# Patient Record
Sex: Male | Born: 1950
Health system: Southern US, Community
[De-identification: ages and names within clinical notes are randomized; demographics above are authoritative.]

## PROBLEM LIST (undated history)

## (undated) DIAGNOSIS — J189 Pneumonia, unspecified organism: Secondary | ICD-10-CM

## (undated) DIAGNOSIS — I509 Heart failure, unspecified: Secondary | ICD-10-CM

## (undated) DIAGNOSIS — J9611 Chronic respiratory failure with hypoxia: Secondary | ICD-10-CM

## (undated) DIAGNOSIS — W5911XA Bitten by nonvenomous snake, initial encounter: Secondary | ICD-10-CM

---
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
## 1978-04-15 DIAGNOSIS — W5911XA Bitten by nonvenomous snake, initial encounter: Secondary | ICD-10-CM

## 1978-04-15 HISTORY — DX: Bitten by nonvenomous snake, initial encounter: W59.11XA

## 2016-01-16 DIAGNOSIS — I509 Heart failure, unspecified: Secondary | ICD-10-CM

## 2016-01-16 DIAGNOSIS — J9600 Acute respiratory failure, unspecified whether with hypoxia or hypercapnia: Secondary | ICD-10-CM

## 2016-01-16 DIAGNOSIS — E661 Drug-induced obesity: Secondary | ICD-10-CM

## 2016-01-16 DIAGNOSIS — I501 Left ventricular failure: Secondary | ICD-10-CM

## 2016-01-17 DIAGNOSIS — I509 Heart failure, unspecified: Secondary | ICD-10-CM | POA: Diagnosis not present

## 2016-01-17 DIAGNOSIS — E661 Drug-induced obesity: Secondary | ICD-10-CM | POA: Diagnosis not present

## 2016-01-17 DIAGNOSIS — J9600 Acute respiratory failure, unspecified whether with hypoxia or hypercapnia: Secondary | ICD-10-CM | POA: Diagnosis not present

## 2016-01-17 DIAGNOSIS — I501 Left ventricular failure: Secondary | ICD-10-CM | POA: Diagnosis not present

## 2016-01-18 DIAGNOSIS — I501 Left ventricular failure: Secondary | ICD-10-CM | POA: Diagnosis not present

## 2016-01-18 DIAGNOSIS — J9600 Acute respiratory failure, unspecified whether with hypoxia or hypercapnia: Secondary | ICD-10-CM | POA: Diagnosis not present

## 2016-01-18 DIAGNOSIS — I509 Heart failure, unspecified: Secondary | ICD-10-CM | POA: Diagnosis not present

## 2016-01-18 DIAGNOSIS — E661 Drug-induced obesity: Secondary | ICD-10-CM | POA: Diagnosis not present

## 2016-01-19 DIAGNOSIS — J9600 Acute respiratory failure, unspecified whether with hypoxia or hypercapnia: Secondary | ICD-10-CM | POA: Diagnosis not present

## 2016-01-19 DIAGNOSIS — I509 Heart failure, unspecified: Secondary | ICD-10-CM | POA: Diagnosis not present

## 2016-01-19 DIAGNOSIS — E661 Drug-induced obesity: Secondary | ICD-10-CM | POA: Diagnosis not present

## 2016-01-19 DIAGNOSIS — I501 Left ventricular failure: Secondary | ICD-10-CM | POA: Diagnosis not present

## 2016-01-20 DIAGNOSIS — I509 Heart failure, unspecified: Secondary | ICD-10-CM | POA: Diagnosis not present

## 2016-01-20 DIAGNOSIS — I501 Left ventricular failure: Secondary | ICD-10-CM | POA: Diagnosis not present

## 2016-01-20 DIAGNOSIS — J9600 Acute respiratory failure, unspecified whether with hypoxia or hypercapnia: Secondary | ICD-10-CM | POA: Diagnosis not present

## 2016-01-20 DIAGNOSIS — E661 Drug-induced obesity: Secondary | ICD-10-CM | POA: Diagnosis not present

## 2016-01-21 ENCOUNTER — Inpatient Hospital Stay (HOSPITAL_COMMUNITY)
Admission: AD | Admit: 2016-01-21 | Discharge: 2016-01-30 | DRG: 189 | Disposition: A | Discharge status: Home / Self Care | Payer: Medicaid Other | Source: Other Acute Inpatient Hospital | Attending: Internal Medicine | Admitting: Internal Medicine

## 2016-01-21 ENCOUNTER — Inpatient Hospital Stay (HOSPITAL_COMMUNITY): Payer: Medicaid Other

## 2016-01-21 DIAGNOSIS — J9601 Acute respiratory failure with hypoxia: Secondary | ICD-10-CM | POA: Diagnosis present

## 2016-01-21 DIAGNOSIS — J9621 Acute and chronic respiratory failure with hypoxia: Principal | ICD-10-CM

## 2016-01-21 DIAGNOSIS — R0902 Hypoxemia: Secondary | ICD-10-CM

## 2016-01-21 DIAGNOSIS — Z87891 Personal history of nicotine dependence: Secondary | ICD-10-CM | POA: Diagnosis not present

## 2016-01-21 DIAGNOSIS — I509 Heart failure, unspecified: Secondary | ICD-10-CM | POA: Diagnosis not present

## 2016-01-21 DIAGNOSIS — E119 Type 2 diabetes mellitus without complications: Secondary | ICD-10-CM

## 2016-01-21 DIAGNOSIS — J810 Acute pulmonary edema: Secondary | ICD-10-CM | POA: Diagnosis present

## 2016-01-21 DIAGNOSIS — J9602 Acute respiratory failure with hypercapnia: Secondary | ICD-10-CM | POA: Diagnosis present

## 2016-01-21 DIAGNOSIS — E873 Alkalosis: Secondary | ICD-10-CM | POA: Diagnosis not present

## 2016-01-21 DIAGNOSIS — I517 Cardiomegaly: Secondary | ICD-10-CM | POA: Diagnosis not present

## 2016-01-21 DIAGNOSIS — E1165 Type 2 diabetes mellitus with hyperglycemia: Secondary | ICD-10-CM | POA: Diagnosis present

## 2016-01-21 DIAGNOSIS — Z6826 Body mass index (BMI) 26.0-26.9, adult: Secondary | ICD-10-CM | POA: Diagnosis not present

## 2016-01-21 DIAGNOSIS — D751 Secondary polycythemia: Secondary | ICD-10-CM | POA: Diagnosis present

## 2016-01-21 DIAGNOSIS — I272 Pulmonary hypertension, unspecified: Secondary | ICD-10-CM | POA: Diagnosis present

## 2016-01-21 DIAGNOSIS — J8410 Pulmonary fibrosis, unspecified: Secondary | ICD-10-CM | POA: Diagnosis present

## 2016-01-21 DIAGNOSIS — I501 Left ventricular failure: Secondary | ICD-10-CM | POA: Diagnosis not present

## 2016-01-21 DIAGNOSIS — R0602 Shortness of breath: Secondary | ICD-10-CM | POA: Diagnosis present

## 2016-01-21 DIAGNOSIS — J9600 Acute respiratory failure, unspecified whether with hypoxia or hypercapnia: Secondary | ICD-10-CM | POA: Diagnosis not present

## 2016-01-21 DIAGNOSIS — E661 Drug-induced obesity: Secondary | ICD-10-CM | POA: Diagnosis not present

## 2016-01-21 DIAGNOSIS — J9690 Respiratory failure, unspecified, unspecified whether with hypoxia or hypercapnia: Secondary | ICD-10-CM

## 2016-01-21 DIAGNOSIS — J849 Interstitial pulmonary disease, unspecified: Secondary | ICD-10-CM | POA: Diagnosis not present

## 2016-01-21 DIAGNOSIS — J439 Emphysema, unspecified: Secondary | ICD-10-CM | POA: Diagnosis not present

## 2016-01-21 HISTORY — DX: Pneumonia, unspecified organism: J18.9

## 2016-01-21 HISTORY — DX: Bitten by nonvenomous snake, initial encounter: W59.11XA

## 2016-01-21 LAB — COMPREHENSIVE METABOLIC PANEL
ALBUMIN: 2.6 g/dL — AB (ref 3.5–5.0)
ALK PHOS: 48 U/L (ref 38–126)
ALT: 21 U/L (ref 17–63)
ANION GAP: 8 (ref 5–15)
AST: 25 U/L (ref 15–41)
BUN: 15 mg/dL (ref 6–20)
CHLORIDE: 79 mmol/L — AB (ref 101–111)
CO2: 49 mmol/L — AB (ref 22–32)
Calcium: 8 mg/dL — ABNORMAL LOW (ref 8.9–10.3)
Creatinine, Ser: 0.81 mg/dL (ref 0.61–1.24)
GFR calc non Af Amer: >60 mL/min (ref >60)
GLUCOSE: 162 mg/dL — AB (ref 65–99)
POTASSIUM: 3.6 mmol/L (ref 3.5–5.1)
SODIUM: 136 mmol/L (ref 135–145)
Total Bilirubin: 1.2 mg/dL (ref 0.3–1.2)
Total Protein: 6.5 g/dL (ref 6.5–8.1)

## 2016-01-21 LAB — CBC WITH DIFFERENTIAL/PLATELET
BASOS PCT: 0 %
Basophils Absolute: 0 10*3/uL (ref 0.0–0.1)
EOS ABS: 0 10*3/uL (ref 0.0–0.7)
EOS PCT: 0 %
HCT: 52.3 % — ABNORMAL HIGH (ref 39.0–52.0)
HEMOGLOBIN: 14.9 g/dL (ref 13.0–17.0)
LYMPHS ABS: 0.6 10*3/uL — AB (ref 0.7–4.0)
Lymphocytes Relative: 9 %
MCH: 24.5 pg — AB (ref 26.0–34.0)
MCHC: 28.5 g/dL — AB (ref 30.0–36.0)
MCV: 85.9 fL (ref 78.0–100.0)
MONOS PCT: 5 %
Monocytes Absolute: 0.4 10*3/uL (ref 0.1–1.0)
NEUTROS PCT: 86 %
Neutro Abs: 5.9 10*3/uL (ref 1.7–7.7)
PLATELETS: 94 10*3/uL — AB (ref 150–400)
RBC: 6.09 MIL/uL — ABNORMAL HIGH (ref 4.22–5.81)
RDW: 17.8 % — ABNORMAL HIGH (ref 11.5–15.5)
WBC: 6.8 10*3/uL (ref 4.0–10.5)

## 2016-01-21 LAB — BLOOD GAS, ARTERIAL
ACID-BASE EXCESS: 28.6 mmol/L — AB (ref 0.0–2.0)
Bicarbonate: 55.6 mmol/L — ABNORMAL HIGH (ref 20.0–28.0)
DRAWN BY: 42624
Delivery systems: POSITIVE
Expiratory PAP: 6
FIO2: 0.35
INSPIRATORY PAP: 12
LHR: 12 {breaths}/min
O2 SAT: 88 %
PATIENT TEMPERATURE: 98.6
PCO2 ART: 90.4 mmHg — AB (ref 32.0–48.0)
PH ART: 7.406 (ref 7.350–7.450)
PO2 ART: 55.9 mmHg — AB (ref 83.0–108.0)

## 2016-01-21 LAB — MAGNESIUM: Magnesium: 1.8 mg/dL (ref 1.7–2.4)

## 2016-01-21 MED ORDER — SODIUM CHLORIDE 0.9 % IV SOLN: 250.0000 mL | INTRAVENOUS | Status: DC | PRN

## 2016-01-21 MED ORDER — FUROSEMIDE 10 MG/ML IJ SOLN
40.0000 mg | Freq: Once | INTRAMUSCULAR | Status: AC
  Administered 2016-01-21: 40 mg via INTRAVENOUS
  Filled 2016-01-21: qty 4

## 2016-01-21 NOTE — Progress Notes (Signed)
eLink Physician-Brief Progress Note Patient Name: Zachary Brown DOB: 04/13/1951 MRN: [id number]   Date of Service  01/21/2016  HPI/Events of Note  ABG on 35%/EPAP 12/IPAP 6 = 7.40/90.455.9/55.6. Discussed with Dr. Tyson Alias >> put on high flow O2.    eICU Interventions  Will order: 1. D/C BiPAP. 2. High Flow O2. Keep sat 88% to 90%.     Intervention Category Major Interventions: Respiratory failure - evaluation and management;Acid-Base disturbance - evaluation and management  Sommer,Steven Eugene 01/21/2016, 10:13 PM

## 2016-01-21 NOTE — Progress Notes (Signed)
eLink Physician-Brief Progress Note Patient Name: Zachary Brown DOB: 20-Jan-1951 MRN: [id number]   Date of Service  01/21/2016  HPI/Events of Note  CXR looks like CHF to my read. Creatinine = 0.9. BP = 134/83.  eICU Interventions  Will order: 1. Lasix 40 mg IV now. 2. CBC now. 3. CMP now. 4. Mg++ level now.      Intervention Category Intermediate Interventions: Diagnostic test evaluation  Sommer,Steven Eugene 01/21/2016, 9:45 PM

## 2016-01-22 ENCOUNTER — Encounter (HOSPITAL_COMMUNITY): Payer: Self-pay | Admitting: *Deleted

## 2016-01-22 DIAGNOSIS — J9601 Acute respiratory failure with hypoxia: Secondary | ICD-10-CM

## 2016-01-22 DIAGNOSIS — J849 Interstitial pulmonary disease, unspecified: Secondary | ICD-10-CM

## 2016-01-22 DIAGNOSIS — R0902 Hypoxemia: Secondary | ICD-10-CM

## 2016-01-22 LAB — GLUCOSE, CAPILLARY
GLUCOSE-CAPILLARY: 258 mg/dL — AB (ref 65–99)
GLUCOSE-CAPILLARY: 199 mg/dL — AB (ref 65–99)
Glucose-Capillary: 277 mg/dL — ABNORMAL HIGH (ref 65–99)

## 2016-01-22 LAB — CBC WITH DIFFERENTIAL/PLATELET
Basophils Absolute: 0 10*3/uL (ref 0.0–0.1)
Basophils Relative: 0 %
EOS PCT: 0 %
Eosinophils Absolute: 0 10*3/uL (ref 0.0–0.7)
HCT: 52.7 % — ABNORMAL HIGH (ref 39.0–52.0)
Hemoglobin: 14.8 g/dL (ref 13.0–17.0)
LYMPHS ABS: 0.5 10*3/uL — AB (ref 0.7–4.0)
LYMPHS PCT: 6 %
MCH: 24.4 pg — AB (ref 26.0–34.0)
MCHC: 28.1 g/dL — AB (ref 30.0–36.0)
MCV: 87 fL (ref 78.0–100.0)
MONO ABS: 0.2 10*3/uL (ref 0.1–1.0)
MONOS PCT: 3 %
Neutro Abs: 6.3 10*3/uL (ref 1.7–7.7)
Neutrophils Relative %: 90 %
PLATELETS: 100 10*3/uL — AB (ref 150–400)
RBC: 6.06 MIL/uL — ABNORMAL HIGH (ref 4.22–5.81)
RDW: 17.5 % — AB (ref 11.5–15.5)
WBC: 7 10*3/uL (ref 4.0–10.5)

## 2016-01-22 LAB — MAGNESIUM: Magnesium: 1.8 mg/dL (ref 1.7–2.4)

## 2016-01-22 LAB — RESPIRATORY PANEL BY PCR
Adenovirus: NOT DETECTED
Bordetella pertussis: NOT DETECTED
CHLAMYDOPHILA PNEUMONIAE-RVPPCR: NOT DETECTED
CORONAVIRUS OC43-RVPPCR: NOT DETECTED
Coronavirus 229E: NOT DETECTED
Coronavirus HKU1: NOT DETECTED
Coronavirus NL63: NOT DETECTED
INFLUENZA A-RVPPCR: NOT DETECTED
INFLUENZA B-RVPPCR: NOT DETECTED
MYCOPLASMA PNEUMONIAE-RVPPCR: NOT DETECTED
Metapneumovirus: NOT DETECTED
PARAINFLUENZA VIRUS 1-RVPPCR: NOT DETECTED
PARAINFLUENZA VIRUS 4-RVPPCR: NOT DETECTED
Parainfluenza Virus 2: NOT DETECTED
Parainfluenza Virus 3: NOT DETECTED
RESPIRATORY SYNCYTIAL VIRUS-RVPPCR: NOT DETECTED
Rhinovirus / Enterovirus: NOT DETECTED

## 2016-01-22 LAB — BASIC METABOLIC PANEL
ANION GAP: 7 (ref 5–15)
BUN: 17 mg/dL (ref 6–20)
CALCIUM: 8.5 mg/dL — AB (ref 8.9–10.3)
CO2: 50 mmol/L — ABNORMAL HIGH (ref 22–32)
CREATININE: 0.78 mg/dL (ref 0.61–1.24)
Chloride: 80 mmol/L — ABNORMAL LOW (ref 101–111)
GFR calc non Af Amer: >60 mL/min (ref >60)
Glucose, Bld: 223 mg/dL — ABNORMAL HIGH (ref 65–99)
Potassium: 4 mmol/L (ref 3.5–5.1)
SODIUM: 137 mmol/L (ref 135–145)

## 2016-01-22 LAB — SEDIMENTATION RATE: Sed Rate: 2 mm/hr (ref 0–16)

## 2016-01-22 LAB — PROTIME-INR
INR: 1.16
PROTHROMBIN TIME: 14.8 s (ref 11.4–15.2)

## 2016-01-22 LAB — PHOSPHORUS: Phosphorus: 1.4 mg/dL — ABNORMAL LOW (ref 2.5–4.6)

## 2016-01-22 LAB — APTT: APTT: 32 s (ref 24–36)

## 2016-01-22 LAB — MRSA PCR SCREENING: MRSA by PCR: NEGATIVE

## 2016-01-22 LAB — C-REACTIVE PROTEIN: CRP: 1 mg/dL — ABNORMAL HIGH (ref <1.0)

## 2016-01-22 MED ORDER — INFLUENZA VAC SPLIT QUAD 0.5 ML IM SUSY
0.5000 mL | PREFILLED_SYRINGE | INTRAMUSCULAR | Status: DC
  Filled 2016-01-22: qty 0.5

## 2016-01-22 MED ORDER — POTASSIUM CHLORIDE CRYS ER 20 MEQ PO TBCR
40.0000 meq | EXTENDED_RELEASE_TABLET | Freq: Once | ORAL | Status: AC
  Administered 2016-01-22: 40 meq via ORAL
  Filled 2016-01-22: qty 2

## 2016-01-22 MED ORDER — METHYLPREDNISOLONE SODIUM SUCC 125 MG IJ SOLR
60.0000 mg | Freq: Three times a day (TID) | INTRAMUSCULAR | Status: DC
  Administered 2016-01-22 – 2016-01-27 (×16): 60 mg via INTRAVENOUS
  Filled 2016-01-22 (×18): qty 2

## 2016-01-22 MED ORDER — ENOXAPARIN SODIUM 40 MG/0.4ML ~~LOC~~ SOLN
40.0000 mg | SUBCUTANEOUS | Status: DC
  Administered 2016-01-22 – 2016-01-30 (×9): 40 mg via SUBCUTANEOUS
  Filled 2016-01-22 (×9): qty 0.4

## 2016-01-22 MED ORDER — PNEUMOCOCCAL VAC POLYVALENT 25 MCG/0.5ML IJ INJ
0.5000 mL | INJECTION | INTRAMUSCULAR | Status: DC
  Filled 2016-01-22: qty 0.5

## 2016-01-22 MED ORDER — FUROSEMIDE 10 MG/ML IJ SOLN
40.0000 mg | Freq: Three times a day (TID) | INTRAMUSCULAR | Status: DC
  Administered 2016-01-22 – 2016-01-23 (×4): 40 mg via INTRAVENOUS
  Filled 2016-01-22 (×4): qty 4

## 2016-01-22 MED ORDER — METHYLPREDNISOLONE SODIUM SUCC 125 MG IJ SOLR: 60.0000 mg | Freq: Three times a day (TID) | INTRAMUSCULAR | Status: DC

## 2016-01-22 MED ORDER — INSULIN ASPART 100 UNIT/ML ~~LOC~~ SOLN
0.0000 [IU] | Freq: Three times a day (TID) | SUBCUTANEOUS | Status: DC
  Administered 2016-01-22: 5 [IU] via SUBCUTANEOUS
  Administered 2016-01-22: 2 [IU] via SUBCUTANEOUS
  Administered 2016-01-22: 5 [IU] via SUBCUTANEOUS
  Administered 2016-01-23: 3 [IU] via SUBCUTANEOUS
  Administered 2016-01-23: 5 [IU] via SUBCUTANEOUS
  Administered 2016-01-23: 2 [IU] via SUBCUTANEOUS
  Administered 2016-01-24 (×2): 5 [IU] via SUBCUTANEOUS
  Administered 2016-01-24: 9 [IU] via SUBCUTANEOUS
  Administered 2016-01-25: 2 [IU] via SUBCUTANEOUS
  Administered 2016-01-25: 3 [IU] via SUBCUTANEOUS
  Administered 2016-01-25: 7 [IU] via SUBCUTANEOUS
  Administered 2016-01-26: 3 [IU] via SUBCUTANEOUS
  Administered 2016-01-26: 7 [IU] via SUBCUTANEOUS
  Administered 2016-01-26: 2 [IU] via SUBCUTANEOUS

## 2016-01-22 NOTE — H&P (Signed)
Name: Zachary Brown MRN: [id number] DOB: [birth year]    ADMISSION DATE:  01/21/2016 CONSULTATION DATE: 01/22/16   REFERRING MD :  Karyl Kinnieraneville, MD  CHIEF COMPLAINT:  Bilateral infiltrates  BRIEF PATIENT DESCRIPTION: 65 yr old bilateral infiltrates, 5 days [facility] no improvement   STudies 10/2 CT chest>>>bilateral infiltrates, no PE 10/5 VQ>>>int prob  10/7 CT chest>>>improving 10/8 [facility]   HISTORY OF PRESENT ILLNESS:  65 smoker, some farming inhlation foam?, presented progressive SOb and leg swelling to [facility] [place].  Did receive outpt ABX without improvement. Had leg swelling also. [facility] with treatment lasix and steroids wit slow progress. Had VQ int prob and Ctr chest neg PE but showed bilateral mosaic infiltrates. No fevers , no CP. Received [employer] [place]. Had repeat CT with improved ILD. Remained hypoxic. [facility] but showed up without outr knowledge on 10/8 in SDU. Was on BIPAP, but had NO increase WOB. Bipap dc'ed, O2 45% required. We were asked to intubate him to bronch th ept, which of course was not agreed upon. Remained cujlture neg. Echo showed normal EF%.  Showed mod Pa htn and dilated IVC all c/w rV chronic overload vs acute on chronic.  PAST MEDICAL HISTORY :   has no past medical history on file. 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 Prior to Admission medications   Not on File   Allergies not on file  FAMILY HISTORY:  family history is not on file. 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  HENT: Negative for hearing loss, ear pain, nosebleeds, congestion, sore throat, neck pain, tinnitus and ear discharge.   Eyes: Negative for blurred vision, double vision, photophobia, pain, discharge and  redness.  Respiratory: post nasal drip clearing of throat chronic  Cardiovascular: Negative for chest pain, palpitations, POS orthopnea, claudication, leg swelling and PND.  Gastrointestinal: Negative for heartburn, nausea, vomiting, abdominal pain, diarrhea, constipation, blood in stool and melena.  Genitourinary: Negative for dysuria, urgency, frequency, hematuria and flank pain.  Musculoskeletal: Negative for myalgias, back pain, joint pain and falls.  Skin: Negative for itching and rash.  Neurological: Negative for dizziness, tingling, tremors, sensory change, speech change, focal weakness, seizures, loss of consciousness, weakness and headaches.  Endo/Heme/Allergies: Negative for environmental allergies and polydipsia. Does not bruise/bleed easily.  SUBJECTIVE:   VITAL SIGNS: Temp:  [97.4 F (36.3 C)-97.7 F (36.5 C)] 97.4 F (36.3 C) (10/08 2318) Pulse Rate:  [88-90] 90 (10/08 2318) Resp:  [22-23] 23 (10/08 2318) BP: (133-134)/(83-88) 133/88 (10/08 2318) SpO2:  [92 %-94 %] 94 % (10/08 2318) FiO2 (%):  [35 %-45 %] 45 % (10/08 2318) Weight:  [88.1 kg (194 lb 3.6 oz)] 88.1 kg (194 lb 3.6 oz) (10/08 2100)  PHYSICAL EXAMINATION: General:  Awake, no distress Neuro:  Nonfocal, perr HEENT:  jvd present Cardiovascular:  s1 s12 RRR no r/g Lungs:  Coarse and reduced Abdomen:  Soft, BS wnl, no r Musculoskeletal:  Edema lowers  3 Skin:  No rash   Recent Labs Lab 01/21/16 2206  NA 136  K 3.6  CL 79*  CO2 49*  BUN 15  CREATININE 0.81  GLUCOSE 162*    Recent Labs Lab 01/21/16 2206  HGB 14.9  HCT 52.3*  WBC  6.8  PLT 94*   Dg Chest Port 1 View  Result Date: 01/21/2016 CLINICAL DATA:  Acute onset of hypoxia.  Initial encounter. EXAM: PORTABLE CHEST 1 VIEW COMPARISON:  Chest radiograph and CT of the chest performed 01/20/2016 FINDINGS: The lungs are well-aerated. Vascular congestion is noted. Increased interstitial markings raise concern for pulmonary edema or pneumonia.  There is no evidence of pleural effusion or pneumothorax. The cardiomediastinal silhouette is borderline normal in size. No acute osseous abnormalities are seen. IMPRESSION: Vascular congestion noted. Increased interstitial markings raise concern for pulmonary edema or pneumonia. Electronically Signed   By: Roanna Raider M.D.   On: 01/21/2016 22:01    ASSESSMENT / PLAN:  Hypoxic resp failure, chronic hypercapnic compensated resp failure ILD - likely Noninfectious, fibrosis PULM HTN R/o pulm edema component on top of chronic lung dz Unlikely infectious bacterial PNA R/o viral  Secondary polycythemia (hypoxia)  His last CT has shown improvement, consider steroids to continue With NO increase WOB, dc bipap ABG Lasix to continued to neg balance pcxr in am, overall this has improved Viral panel if not done at [facility] Dc lasix drip, use int lasix Chem  scd lovenox , crt daily Would NOT bronch him, he would need intubation and MAY not liberate well off Consider auti immune panel  Mcarthur Rossetti. Tyson Alias, MD, FACP Pgr: [phone] [facility]  Pulmonary and Critical Care Medicine [facility] Pager: [phone]  01/22/2016, 1:03 AM

## 2016-01-23 ENCOUNTER — Inpatient Hospital Stay (HOSPITAL_COMMUNITY): Payer: Medicaid Other

## 2016-01-23 DIAGNOSIS — E873 Alkalosis: Secondary | ICD-10-CM

## 2016-01-23 LAB — GLUCOSE, CAPILLARY
GLUCOSE-CAPILLARY: 234 mg/dL — AB (ref 65–99)
Glucose-Capillary: 204 mg/dL — ABNORMAL HIGH (ref 65–99)
Glucose-Capillary: 281 mg/dL — ABNORMAL HIGH (ref 65–99)
Glucose-Capillary: 190 mg/dL — ABNORMAL HIGH (ref 65–99)

## 2016-01-23 LAB — C4 COMPLEMENT: Complement C4, Body Fluid: 18 mg/dL (ref 14–44)

## 2016-01-23 LAB — MPO/PR-3 (ANCA) ANTIBODIES

## 2016-01-23 LAB — ANCA TITERS
C-ANCA: <1:20 {titer}
P-ANCA: <1:20 {titer}

## 2016-01-23 LAB — COMPLEMENT, TOTAL: Compl, Total (CH50): >60 U/mL — ABNORMAL HIGH (ref 42–60)

## 2016-01-23 LAB — BRAIN NATRIURETIC PEPTIDE: B Natriuretic Peptide: 255 pg/mL — ABNORMAL HIGH (ref 0.0–100.0)

## 2016-01-23 LAB — SEDIMENTATION RATE: Sed Rate: 0 mm/hr (ref 0–16)

## 2016-01-23 LAB — ANTINUCLEAR ANTIBODIES, IFA: ANTINUCLEAR ANTIBODIES, IFA: NEGATIVE

## 2016-01-23 LAB — C3 COMPLEMENT: C3 Complement: 117 mg/dL (ref 82–167)

## 2016-01-23 LAB — C-REACTIVE PROTEIN: CRP: <0.8 mg/dL (ref <1.0)

## 2016-01-23 LAB — RHEUMATOID FACTOR: Rhuematoid fact SerPl-aCnc: 10 IU/mL (ref 0.0–13.9)

## 2016-01-23 MED ORDER — INFLUENZA VAC SPLIT QUAD 0.5 ML IM SUSY: 0.5000 mL | PREFILLED_SYRINGE | INTRAMUSCULAR | Status: DC | PRN

## 2016-01-23 MED ORDER — PNEUMOCOCCAL VAC POLYVALENT 25 MCG/0.5ML IJ INJ: 0.5000 mL | INJECTION | INTRAMUSCULAR | Status: DC | PRN

## 2016-01-23 MED ORDER — POTASSIUM PHOSPHATES 15 MMOLE/5ML IV SOLN
30.0000 mmol | Freq: Once | INTRAVENOUS | Status: AC
  Administered 2016-01-23: 30 mmol via INTRAVENOUS
  Filled 2016-01-23: qty 10

## 2016-01-23 MED ORDER — LEVOFLOXACIN 750 MG PO TABS
750.0000 mg | ORAL_TABLET | Freq: Every day | ORAL | Status: AC
  Administered 2016-01-23 – 2016-01-29 (×7): 750 mg via ORAL
  Filled 2016-01-23 (×7): qty 1

## 2016-01-23 MED ORDER — ZOLPIDEM TARTRATE 5 MG PO TABS
5.0000 mg | ORAL_TABLET | Freq: Once | ORAL | Status: AC
  Administered 2016-01-23: 5 mg via ORAL
  Filled 2016-01-23: qty 1

## 2016-01-23 MED ORDER — ACETAZOLAMIDE SODIUM 500 MG IJ SOLR
500.0000 mg | Freq: Four times a day (QID) | INTRAMUSCULAR | Status: AC
  Administered 2016-01-23 – 2016-01-24 (×3): 500 mg via INTRAVENOUS
  Filled 2016-01-23 (×3): qty 500

## 2016-01-23 NOTE — Care Management Note (Addendum)
Case Management Note  Patient Details  Name: Zachary Brown MRN: [id number] Date of Birth: Aug 26, 1950  Subjective/Objective:  Patient lives with wife, pta indep, lives in [place], he went to urgent care before coming to hospital, he hs no PCP, would like to go to our [facility] clinic. He has apt scheduled for 10/16 at 2 :30.  He has transportation at discharge.  He is not on home oxygen.  NCM will cont to follow for dc needs.     01/24/16 Letha Cape RN, NCM- patient most likely will need home oxygen, patient states he would like to work with [facility], made referral to [facility] with [facility]  For charity for home oxygen, patient has no insurance.  He has chronic hypercapnic compensated resp failure.             Action/Plan:   Expected Discharge Date:                  Expected Discharge Plan:  Home/Self Care  In-House Referral:     Discharge planning Services  CM Consult, Medication Assistance, Indigent Health Clinic  Post Acute Care Choice:    Choice offered to:     DME Arranged:    DME Agency:     HH Arranged:    HH Agency:     Status of Service:  In process, will continue to follow  If discussed at Long Length of Stay Meetings, dates discussed:    Additional Comments:  Leone Haven, RN 01/23/2016, 4:54 PM

## 2016-01-23 NOTE — Progress Notes (Addendum)
Name: Zachary Brown MRN: [id number] DOB: Dec 23, 1950    ADMISSION DATE:  01/21/2016 CONSULTATION DATE: 01/22/16   REFERRING MD :  Karyl Kinnier, MD  CHIEF COMPLAINT:  Bilateral infiltrates  BRIEF PATIENT DESCRIPTION: 65 yr old former smoker ( Quit 34 years ago) with  bilateral infiltrates. He received  5 days lasix and steroids at [facility] with  no improvement. Transferred to [facility] for further treatment/ work up 10/8   Studies 10/2 CT chest>>>bilateral infiltrates, no PE 10/5 VQ>>>int prob  10/7 CT chest>>>improving 10/8 [facility]  Cultures: 01/22/2016>> Viral Panel Negative 01/22/2016>> MRSA Negative   HISTORY OF PRESENT ILLNESS:  27 smoker with history of inhaling  some farming  foam?, presented with progressive SOB and leg swelling to [place] 10./3.  Did receive outpt ABX without improvement. At [place] he with treatment lasix and steroids with slow progress. Had VQ int prob and Ctr chest neg PE but showed bilateral mosaic infiltrates. No fevers , no CP. Received [employer]. Had repeat CT with improved ILD. Remained hypoxic. [facility] but showed up without our knowledge on 10/8 in SDU. Was on BIPAP, but had NO increase WOB. Bipap dc'ed, O2 45% required. We were asked to intubate him to bronch th ept, which of course was not agreed upon. Remained culture neg. Echo showed normal EF%.  Showed mod PA htn and dilated IVC all c/w RV chronic overload vs acute on chronic.  SUBJECTIVE:  Pt. States his breathing is much improved. Feels better.  VITAL SIGNS: Temp:  [97.3 F (36.3 C)-98.6 F (37 C)] 97.7 F (36.5 C) (10/10 0751) Pulse Rate:  [87-107] 98 (10/10 0309) Resp:  [13-33] 30 (10/10 0309) BP: (131-147)/(63-94) 147/82 (10/10 0309) SpO2:  [88 %-94 %] 94 % (10/10 0309) FiO2 (%):  [50 %-60 %] 60 % (10/10 0114) Weight:  [180 lb 12.4 oz (82 kg)] 180 lb 12.4 oz (82 kg) (10/10 0500)  PHYSICAL EXAMINATION: General:  Awake, no distress, on high  flow oxygen Neuro:  Nonfocal, perr HEENT:  jvd present Cardiovascular:  s1 s12 RRR no r/g Lungs:  Coarse and reduced, crackles L>R Abdomen:  Soft, BS wnl, no r Musculoskeletal:  Edema lowers 2+ Skin:  No rash, tenting   Recent Labs Lab 01/21/16 2206 01/22/16 0547  NA 136 137  K 3.6 4.0  CL 79* 80*  CO2 49* 50*  BUN 15 17  CREATININE 0.81 0.78  GLUCOSE 162* 223*    Recent Labs Lab 01/21/16 2206 01/22/16 0547  HGB 14.9 14.8  HCT 52.3* 52.7*  WBC 6.8 7.0  PLT 94* 100*   Dg Chest Port 1 View  Result Date: 01/21/2016 CLINICAL DATA:  Acute onset of hypoxia.  Initial encounter. EXAM: PORTABLE CHEST 1 VIEW COMPARISON:  Chest radiograph and CT of the chest performed 01/20/2016 FINDINGS: The lungs are well-aerated. Vascular congestion is noted. Increased interstitial markings raise concern for pulmonary edema or pneumonia. There is no evidence of pleural effusion or pneumothorax. The cardiomediastinal silhouette is borderline normal in size. No acute osseous abnormalities are seen. IMPRESSION: Vascular congestion noted. Increased interstitial markings raise concern for pulmonary edema or pneumonia. Electronically Signed   By: Roanna Raider M.D.   On: 01/21/2016 22:01    ASSESSMENT / PLAN:  Pulmonary:  A:  Hypoxic resp failure, chronic hypercapnic compensated resp failure ILD - likely Noninfectious, fibrosis PULM HTN R/o pulm edema component on top of chronic lung dz Unlikely infectious bacterial PNA Secondary polycythemia (hypoxia) P:  Continue Steroids Continue High Flow Oxygen Titrate for SaO2>> 90% Lasix to continued to neg balance>> lower extremity edema improving Trend Daily CXR Viral panel negative Continue Lasix 40 IV q 8 Trend BMET Daily Replete electrolytes as needed SCD's lovenox , crt daily NO bronch for now, he would need intubation and MAY not liberate well off Consider auto immune panel Consider Cards consult  Will need outpatient follow up for ILD,  PAH after DC  Pt. Updated on his progress and plan of care at bedside. No family present  Bevelyn NgoSarah F. Groce, AGACNP-BC [place] [facility] Pager: [phone]  01/23/2016, 8:43 AM  Attending Note:  65 year old male who had no respiratory complaints 2 wks ago presenting now with acute hypoxemic respiratory failure with concern for bronchiolitis vs atypical infection.  Failing to improve in amount of O2 demand.  On exam, diffuse crackles in all lung fields.  I reviewed CXR myself, improved aeration and CT with acute inflammation noted.  Discussed with PCCM-NP.  Patient wishes to be full code for now but no prolonged respiratory support.  Acute hypoxemic respiratory failure: etiology unknown.  - Titrate O2 for sat of 88-92%.  - Continue steroids  ?ILD:  - Steroids.  - Auto-immune panel  ?atypical infection  - Levaquin  - F/U on culture  Acute pulmonary edema  - 2D echo  - BNP  Contraction alkalosis  - D/C lasix  - Add diamox  - BMET in AM  Patient seen and examined, agree with above note.  I dictated the care and orders written for this patient under my direction.  Alyson ReedyWesam G Xzavior Reinig, MD [phone]

## 2016-01-23 NOTE — Progress Notes (Signed)
eLink Physician-Brief Progress Note Patient Name: Zachary Brown DOB: [birth year] MRN: [id number]   Date of Service  01/23/2016  HPI/Events of Note  Pt asking for a "sleeping pill"  eICU Interventions  ambien 5mg  ordered     Intervention Category Minor Interventions: Routine modifications to care plan (e.g. PRN medications for pain, fever)  Ainslee Sou S. 01/23/2016, 8:47 PM

## 2016-01-23 NOTE — Progress Notes (Signed)
Inpatient Diabetes Program Recommendations  AACE/ADA: New Consensus Statement on Inpatient Glycemic Control (2015)  Target Ranges:  Prepandial:   less than 140 mg/dL      Peak postprandial:   less than 180 mg/dL (1-2 hours)      Critically ill patients:  140 - 180 mg/dL   Lab Results  Component Value Date   GLUCAP 234 (H) 01/23/2016    Review of Glycemic Control  Diabetes history: No prior hx Current orders for Inpatient glycemic control: Novolog correction 0-9 units tid  Inpatient Diabetes Program Recommendations:    Please consider A1c and add Novolog correction 0-5 units hs.  Thank you, Billy Fischer. Denney Shein, RN, MSN, CDE Inpatient Glycemic Control Team Team Pager [phone] (8am-5pm) 01/23/2016 10:26 AM

## 2016-01-24 ENCOUNTER — Inpatient Hospital Stay (HOSPITAL_COMMUNITY): Payer: Medicaid Other

## 2016-01-24 LAB — GLUCOSE, CAPILLARY
GLUCOSE-CAPILLARY: 352 mg/dL — AB (ref 65–99)
GLUCOSE-CAPILLARY: 290 mg/dL — AB (ref 65–99)
GLUCOSE-CAPILLARY: 280 mg/dL — AB (ref 65–99)
Glucose-Capillary: 283 mg/dL — ABNORMAL HIGH (ref 65–99)

## 2016-01-24 LAB — CBC
HEMATOCRIT: 52 % (ref 39.0–52.0)
HEMOGLOBIN: 15.1 g/dL (ref 13.0–17.0)
MCH: 24 pg — ABNORMAL LOW (ref 26.0–34.0)
MCHC: 29 g/dL — AB (ref 30.0–36.0)
MCV: 82.8 fL (ref 78.0–100.0)
PLATELETS: 107 10*3/uL — AB (ref 150–400)
RBC: 6.28 MIL/uL — ABNORMAL HIGH (ref 4.22–5.81)
RDW: 17.5 % — AB (ref 11.5–15.5)
WBC: 7.4 10*3/uL (ref 4.0–10.5)

## 2016-01-24 LAB — ANGIOTENSIN CONVERTING ENZYME: Angiotensin-Converting Enzyme: 50 U/L (ref 14–82)

## 2016-01-24 LAB — BASIC METABOLIC PANEL
ANION GAP: 8 (ref 5–15)
BUN: 22 mg/dL — ABNORMAL HIGH (ref 6–20)
CALCIUM: 8.7 mg/dL — AB (ref 8.9–10.3)
CHLORIDE: 93 mmol/L — AB (ref 101–111)
CO2: 35 mmol/L — AB (ref 22–32)
Creatinine, Ser: 0.96 mg/dL (ref 0.61–1.24)
GFR calc Af Amer: >60 mL/min (ref >60)
GFR calc non Af Amer: >60 mL/min (ref >60)
GLUCOSE: 348 mg/dL — AB (ref 65–99)
Potassium: 3.7 mmol/L (ref 3.5–5.1)
Sodium: 136 mmol/L (ref 135–145)

## 2016-01-24 LAB — PHOSPHORUS: PHOSPHORUS: 3 mg/dL (ref 2.5–4.6)

## 2016-01-24 LAB — MAGNESIUM: Magnesium: 2 mg/dL (ref 1.7–2.4)

## 2016-01-24 MED ORDER — ACETAZOLAMIDE SODIUM 500 MG IJ SOLR
250.0000 mg | Freq: Four times a day (QID) | INTRAMUSCULAR | Status: AC
  Administered 2016-01-24 – 2016-01-25 (×3): 250 mg via INTRAVENOUS
  Filled 2016-01-24 (×4): qty 250

## 2016-01-24 MED ORDER — FUROSEMIDE 10 MG/ML IJ SOLN
40.0000 mg | Freq: Three times a day (TID) | INTRAMUSCULAR | Status: AC
  Administered 2016-01-24 (×2): 40 mg via INTRAVENOUS
  Filled 2016-01-24 (×2): qty 4

## 2016-01-24 NOTE — Progress Notes (Signed)
RT titrated FIO2 to 30% and flow to 20L. Pt remains comfortable, sat 91%. MD sat goal 88 - 92%.  RN notified.

## 2016-01-24 NOTE — Progress Notes (Signed)
Inpatient Diabetes Program Recommendations  AACE/ADA: New Consensus Statement on Inpatient Glycemic Control (2015)  Target Ranges:  Prepandial:   less than 140 mg/dL      Peak postprandial:   less than 180 mg/dL (1-2 hours)      Critically ill patients:  140 - 180 mg/dL   Lab Results  Component Value Date   GLUCAP 290 (H) 01/24/2016    Review of Glycemic Control Results for [name], [name] (MRN [id number]) as of 01/24/2016 11:45  Ref. Range 01/23/2016 07:52 01/23/2016 11:36 01/23/2016 16:16 01/23/2016 21:46 01/24/2016 08:22 01/24/2016 11:40  Glucose-Capillary Latest Ref Range: 65 - 99 mg/dL 956 (H) 213 (H) 086 (H) 190 (H) 352 (H) 290 (H)   Diabetes history: No prior hx Current orders for Inpatient glycemic control: Novolog correction 0-9 units tid  Inpatient Diabetes Program Recommendations:    Noted CBGs 190-352.  -Increase Novolog correction to moderate 0-15 units tid+0-5 units hs -A1c   Thank you, Zachary Brown. Zachary Lashley, RN, MSN, CDE Inpatient Glycemic Control Team Team Pager [phone] (8am-5pm) 01/24/2016 11:48 AM

## 2016-01-24 NOTE — Progress Notes (Signed)
Name: Zachary Brown MRN: [id number] DOB: [birth year]    ADMISSION DATE:  01/21/2016 CONSULTATION DATE: 01/22/16   REFERRING MD :  Karyl Kinnier, MD  CHIEF COMPLAINT:  Bilateral infiltrates  BRIEF PATIENT DESCRIPTION: 65 yr old former smoker ( Quit 34 years ago) with  bilateral infiltrates. He received  5 days lasix and steroids at [facility] with  no improvement. Transferred to [facility] for further treatment/ work up 10/8   Studies 10/2 CT chest>>>bilateral infiltrates, no PE 10/5 VQ>>>int prob  10/7 CT chest>>>improving 10/8 [facility]  Cultures: 01/22/2016>> Viral Panel Negative 01/22/2016>> MRSA Negative   HISTORY OF PRESENT ILLNESS:  24 smoker with history of inhaling  some farming  foam?, presented with progressive SOB and leg swelling to [place] 10./3.  Did receive outpt ABX without improvement. At [place] he with treatment lasix and steroids with slow progress. Had VQ int prob and Ctr chest neg PE but showed bilateral mosaic infiltrates. No fevers , no CP. Received [employer]. Had repeat CT with improved ILD. Remained hypoxic. [facility] but showed up without our knowledge on 10/8 in SDU. Was on BIPAP, but had NO increase WOB. Bipap dc'ed, O2 45% required. We were asked to intubate him to bronch th ept, which of course was not agreed upon. Remained culture neg. Echo showed normal EF%.  Showed mod PA htn and dilated IVC all c/w RV chronic overload vs acute on chronic.  SUBJECTIVE:  Pt. States his breathing is much improved. Feels better.  VITAL SIGNS: Temp:  [97.2 F (36.2 C)-98.1 F (36.7 C)] 97.2 F (36.2 C) (10/11 0720) Pulse Rate:  [87-109] 94 (10/11 0912) Resp:  [16-24] 24 (10/11 0912) BP: (127-148)/(67-90) 127/81 (10/11 0720) SpO2:  [92 %-96 %] 92 % (10/11 0912) FiO2 (%):  [50 %] 50 % (10/11 0912) Weight:  [80.9 kg (178 lb 6.4 oz)] 80.9 kg (178 lb 6.4 oz) (10/11 0347)  PHYSICAL EXAMINATION: General:  Awake, no distress, on high  flow oxygen Neuro:  Nonfocal, perr HEENT:  jvd present Cardiovascular:  s1 s12 RRR no r/g Lungs:  Coarse and reduced, crackles L>R Abdomen:  Soft, BS wnl, no r Musculoskeletal:  Edema lowers 2+ Skin:  No rash, tenting   Recent Labs Lab 01/21/16 2206 01/22/16 0547 01/24/16 0355  NA 136 137 136  K 3.6 4.0 3.7  CL 79* 80* 93*  CO2 49* 50* 35*  BUN 15 17 22*  CREATININE 0.81 0.78 0.96  GLUCOSE 162* 223* 348*    Recent Labs Lab 01/21/16 2206 01/22/16 0547 01/24/16 0355  HGB 14.9 14.8 15.1  HCT 52.3* 52.7* 52.0  WBC 6.8 7.0 7.4  PLT 94* 100* 107*   Dg Chest Port 1 View  Result Date: 01/24/2016 CLINICAL DATA:  Acute on chronic respiratory failure with hypoxemia EXAM: PORTABLE CHEST 1 VIEW COMPARISON:  01/23/2016 FINDINGS: Mild improvement in bilateral airspace disease. Improvement is greater on the left than the right. Heart size upper normal. Negative for pleural effusion. IMPRESSION: Interval improvement in bilateral airspace disease. Differential includes pulmonary edema and infection. Electronically Signed   By: Marlan Palau M.D.   On: 01/24/2016 09:21   Dg Chest Port 1 View  Result Date: 01/23/2016 CLINICAL DATA:  Interstitial lung disease.  Shortness of breath. EXAM: PORTABLE CHEST 1 VIEW COMPARISON:  01/21/2016 FINDINGS: There continues to be patchy interstitial and airspace densities throughout both lungs. Slightly improved aeration in the right lung. No significant change in the left lung. Heart  and mediastinum are within normal limits. The trachea is midline. Small foci of sclerosis in the proximal left humerus could be related to an enchondroma. IMPRESSION: Minimal change in the bilateral interstitial and airspace disease. Electronically Signed   By: Richarda OverlieAdam  Henn M.D.   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  Failing to improve in amount of O2 demand.  On exam, diffuse crackles in all lung fields.  I reviewed CXR myself, improved aeration and CT with acute inflammation noted.  Discussed with PCCM-NP.  Patient wishes to be full code for now but no prolonged respiratory support.  Acute hypoxemic respiratory failure: etiology unknown.  - Titrate O2 for sat of 88-92%.  - Continue steroids  ?ILD:  - Continue steroids.  - Would consider pirfenidone as outpatient but not right now.  - Auto-immune panel ordered and pending  ?atypical infection  - Levaquin as ordered  - F/U on culture  Acute pulmonary edema  - 2D echo pending  - BNP 225  Contraction alkalosis  - Two additional doses of lasix  - Diamox for 3 doses.  - BMET in AM.  - Replace electrolytes as indicated.  Spoke with patient at length regarding code status, he decided he would not want to be intubated.  Will make DNI.   Patient seen and examined, agree with above note.  I dictated the care and orders written for this patient under my direction.  Alyson ReedyWesam G Kiowa Hollar, MD [phone]

## 2016-01-25 ENCOUNTER — Inpatient Hospital Stay (HOSPITAL_COMMUNITY): Payer: Medicaid Other

## 2016-01-25 DIAGNOSIS — I517 Cardiomegaly: Secondary | ICD-10-CM

## 2016-01-25 DIAGNOSIS — J810 Acute pulmonary edema: Secondary | ICD-10-CM

## 2016-01-25 LAB — GLUCOSE, CAPILLARY
GLUCOSE-CAPILLARY: 175 mg/dL — AB (ref 65–99)
Glucose-Capillary: 312 mg/dL — ABNORMAL HIGH (ref 65–99)
Glucose-Capillary: 204 mg/dL — ABNORMAL HIGH (ref 65–99)
Glucose-Capillary: 377 mg/dL — ABNORMAL HIGH (ref 65–99)

## 2016-01-25 LAB — CBC
HEMATOCRIT: 51.4 % (ref 39.0–52.0)
Hemoglobin: 15.4 g/dL (ref 13.0–17.0)
MCH: 24.5 pg — AB (ref 26.0–34.0)
MCHC: 30 g/dL (ref 30.0–36.0)
MCV: 81.7 fL (ref 78.0–100.0)
Platelets: 114 10*3/uL — ABNORMAL LOW (ref 150–400)
RBC: 6.29 MIL/uL — ABNORMAL HIGH (ref 4.22–5.81)
RDW: 17.3 % — AB (ref 11.5–15.5)
WBC: 8.7 10*3/uL (ref 4.0–10.5)

## 2016-01-25 LAB — ECHOCARDIOGRAM COMPLETE
CHL CUP MV DEC (S): 225
EERAT: 8.92
EWDT: 225 ms
FS: 30 % (ref 28–44)
HEIGHTINCHES: 69 in
IVS/LV PW RATIO, ED: 1.04
LA ID, A-P, ES: 28 mm
LA diam end sys: 28 mm
LA vol A4C: 24.7 ml
LA vol index: 15.9 mL/m2
LADIAMINDEX: 1.41 cm/m2
LAVOL: 31.6 mL
LV E/e' medial: 8.92
LV e' LATERAL: 9.9 cm/s
LVEEAVG: 8.92
LVOT VTI: 20.6 cm
LVOT area: 2.27 cm2
LVOT diameter: 17 mm
LVOT peak grad rest: 6 mmHg
LVOT peak vel: 121 cm/s
LVOTSV: 47 mL
MV Peak grad: 3 mmHg
MV pk A vel: 102 m/s
MVPKEVEL: 88.3 m/s
PW: 12.7 mm — AB (ref 0.6–1.1)
RV TAPSE: 20.2 mm
TDI e' lateral: 9.9
TDI e' medial: 8.49
WEIGHTICAEL: 2836 [oz_av]

## 2016-01-25 LAB — BASIC METABOLIC PANEL
Anion gap: 7 (ref 5–15)
BUN: 23 mg/dL — AB (ref 6–20)
CALCIUM: 8.7 mg/dL — AB (ref 8.9–10.3)
CHLORIDE: 97 mmol/L — AB (ref 101–111)
CO2: 30 mmol/L (ref 22–32)
CREATININE: 0.89 mg/dL (ref 0.61–1.24)
GFR calc non Af Amer: >60 mL/min (ref >60)
Glucose, Bld: 368 mg/dL — ABNORMAL HIGH (ref 65–99)
Potassium: 3.5 mmol/L (ref 3.5–5.1)
Sodium: 134 mmol/L — ABNORMAL LOW (ref 135–145)

## 2016-01-25 LAB — FANA STAINING PATTERNS

## 2016-01-25 LAB — MAGNESIUM: Magnesium: 1.8 mg/dL (ref 1.7–2.4)

## 2016-01-25 LAB — PHOSPHORUS: PHOSPHORUS: 2.7 mg/dL (ref 2.5–4.6)

## 2016-01-25 LAB — ANTINUCLEAR ANTIBODIES, IFA: ANA Ab, IFA: POSITIVE — AB

## 2016-01-25 NOTE — Progress Notes (Signed)
  Echocardiogram 2D Echocardiogram has been performed.  Zachary Brown 01/25/2016, 2:44 PM

## 2016-01-25 NOTE — Progress Notes (Signed)
Inpatient Diabetes Program Recommendations  AACE/ADA: New Consensus Statement on Inpatient Glycemic Control (2015)  Target Ranges:  Prepandial:   less than 140 mg/dL      Peak postprandial:   less than 180 mg/dL (1-2 hours)      Critically ill patients:  140 - 180 mg/dL     Review of Glycemic Control  Results for Zachary Brown, Zachary Brown (MRN [id number]) as of 01/25/2016 13:22  Ref. Range 01/24/2016 08:22 01/24/2016 11:40 01/24/2016 17:09 01/24/2016 21:59 01/25/2016 07:50 01/25/2016 11:41  Glucose-Capillary Latest Ref Range: 65 - 99 mg/dL 244 (H) 628 (H) 638 (H) 280 (H) 312 (H) 175 (H)    Inpatient Diabetes Program Recommendations:    Diabetes history:No prior hx Current orders for Inpatient glycemic control: Novolog correction 0-9 units tid  Inpatient Diabetes Program Recommendations:    Noted CBGs 175-312.  -Increase Novolog correction to moderate 0-15 units tid+0-5 units hs -A1c   Thank you, Billy Fischer. Fradel Baldonado, RN, MSN, CDE Inpatient Glycemic Control Team Team Pager [phone] (8am-5pm) 01/25/2016 1:25 PM

## 2016-01-25 NOTE — Progress Notes (Signed)
Per Dr Molli Knock, wean pt from HFNC. Pt placed on [place] high flow at 10L, sat 98%. Pt tolerating well.

## 2016-01-25 NOTE — Progress Notes (Signed)
Name: Almond Lintony Ray Snead MRN: [id number] DOB: [birth year]    ADMISSION DATE:  01/21/2016 CONSULTATION DATE: 01/22/16   REFERRING MD :  Karyl Kinnieraneville, MD  CHIEF COMPLAINT:  Bilateral infiltrates  BRIEF PATIENT DESCRIPTION: 65 yr old former smoker ( Quit 34 years ago) with  bilateral infiltrates. He received  5 days lasix and steroids [facility] with  no improvement. [facility]/ work up 10/8   Studies 10/2 CT chest>>>bilateral infiltrates, no PE 10/5 VQ>>>int prob  10/7 CT chest>>>improving 10/8 [facility]  Cultures: 01/22/2016>> Viral Panel Negative 01/22/2016>> MRSA Negative   HISTORY OF PRESENT ILLNESS:  [age] smoker with history of inhaling  some farming  foam?, presented with progressive SOB and leg swelling [place].  Did receive outpt ABX without improvement. [place] [place] with [place] las[place] and steroids with slow progress. Had VQ int prob and Ctr chest neg PE but showed bilateral mosaic infiltrates. No fevers , no CP. Received [employer] [place]. Had repeat CT with improved ILD. Remained hypoxic. [facility] but showed up without our knowledge on 10/8 in SDU. Was on BIPAP, but had NO increase WOB. Bipap dc'ed, O2 45% required. We were asked to intubate him to bronch th ept, which of course was not agreed upon. Remained culture neg. Echo showed normal EF%.  Showed mod PA htn and dilated IVC all c/w RV chronic overload vs acute on chronic.  SUBJECTIVE:  Patient states feeling better.  VITAL SIGNS: Temp:  [97.5 F (36.4 C)-98.1 F (36.7 C)] 97.8 F (36.6 C) (10/12 1201) Pulse Rate:  [89-108] 94 (10/12 1201) Resp:  [22-26] 23 (10/12 1201) BP: (133-145)/(80-90) 133/87 (10/12 1201) SpO2:  [88 %-95 %] 95 % (10/12 1201) FiO2 (%):  [30 %-35 %] 35 % (10/12 1346) Weight:  [80.4 kg (177 lb 4 oz)] 80.4 kg (177 lb 4 oz) (10/12 0500)  PHYSICAL EXAMINATION: General:  Awake, no distress, on high flow oxygen Neuro:   Nonfocal, perr HEENT:  jvd present Cardiovascular:  s1 s12 RRR no r/g Lungs:  Coarse and reduced, crackles L>R Abdomen:  Soft, BS wnl, no r Musculoskeletal:  Edema lowers 2+ Skin:  No rash, tenting   Recent Labs Lab 01/22/16 0547 01/24/16 0355 01/25/16 0452  NA 137 136 134*  K 4.0 3.7 3.5  CL 80* 93* 97*  CO2 50* 35* 30  BUN 17 22* 23*  CREATININE 0.78 0.96 0.89  GLUCOSE 223* 348* 368*    Recent Labs Lab 01/22/16 0547 01/24/16 0355 01/25/16 0452  HGB 14.8 15.1 15.4  HCT 52.7* 52.0 51.4  WBC 7.0 7.4 8.7  PLT 100* 107* 114*   Dg Chest Port 1 View  Result Date: 01/24/2016 CLINICAL DATA:  Acute on chronic respiratory failure with hypoxemia EXAM: PORTABLE CHEST 1 VIEW COMPARISON:  01/23/2016 FINDINGS: Mild improvement in bilateral airspace disease. Improvement is greater on the left than the right. Heart size upper normal. Negative for pleural effusion. IMPRESSION: Interval improvement in bilateral airspace disease. Differential includes pulmonary edema and infection. Electronically Signed   By: Marlan Palauharles  Clark M.D.   On: 01/24/2016 09:21    ASSESSMENT / PLAN:  Pulmonary:  A:  Hypoxic resp failure, chronic hypercapnic compensated resp failure ILD - likely Noninfectious, fibrosis PULM HTN R/o pulm edema component on top of chronic lung dz Unlikely infectious bacterial PNA Secondary polycythemia (hypoxia) P:  65 year old male who had no respiratory complaints 2 wks ago presenting now with acute  hypoxemic respiratory failure with concern for bronchiolitis vs atypical infection.  Failing to improve in amount of O2 demand.  On exam, diffuse crackles in all lung fields.  I reviewed CXR myself, improved aeration and CT with acute inflammation noted.  Discussed with PCCM-NP.  Patient wishes to be full code for now but no prolonged respiratory support.  Acute hypoxemic respiratory failure: etiology unknown.  - Titrate O2 for sat of 88-92%.  - Continue steroids  - Spoke with  RT will attempt to get to standard nasal canula today and evaluate O2 need.  ?ILD:  - Continue steroids.  - Would consider pirfenidone as outpatient but not right now.  - Auto-immune panel ordered and pending  ?atypical infection  - Levaquin as ordered  - F/U on culture  Acute pulmonary edema  - 2D echo pending  - BNP 225  Contraction alkalosis  - Two additional doses of lasix  - Diamox for 3 doses.  - BMET in AM.  - Replace electrolytes as indicated.  DNI status confirmed.  Patient seen and examined, agree with above note.  I dictated the care and orders written for this patient under my direction.  Alyson Reedy, MD [phone]

## 2016-01-26 LAB — BASIC METABOLIC PANEL
Anion gap: 7 (ref 5–15)
BUN: 21 mg/dL — ABNORMAL HIGH (ref 6–20)
CALCIUM: 8.5 mg/dL — AB (ref 8.9–10.3)
CO2: 27 mmol/L (ref 22–32)
CREATININE: 0.85 mg/dL (ref 0.61–1.24)
Chloride: 102 mmol/L (ref 101–111)
GFR calc Af Amer: >60 mL/min (ref >60)
GFR calc non Af Amer: >60 mL/min (ref >60)
GLUCOSE: 385 mg/dL — AB (ref 65–99)
Potassium: 4.3 mmol/L (ref 3.5–5.1)
Sodium: 136 mmol/L (ref 135–145)

## 2016-01-26 LAB — GLUCOSE, CAPILLARY
GLUCOSE-CAPILLARY: 189 mg/dL — AB (ref 65–99)
Glucose-Capillary: 211 mg/dL — ABNORMAL HIGH (ref 65–99)
Glucose-Capillary: 304 mg/dL — ABNORMAL HIGH (ref 65–99)
Glucose-Capillary: 435 mg/dL — ABNORMAL HIGH (ref 65–99)

## 2016-01-26 MED ORDER — INSULIN GLARGINE 100 UNIT/ML ~~LOC~~ SOLN
12.0000 [IU] | Freq: Every day | SUBCUTANEOUS | Status: DC
  Administered 2016-01-26 – 2016-01-28 (×3): 12 [IU] via SUBCUTANEOUS
  Filled 2016-01-26 (×4): qty 0.12

## 2016-01-26 MED ORDER — FUROSEMIDE 10 MG/ML IJ SOLN
40.0000 mg | Freq: Two times a day (BID) | INTRAMUSCULAR | Status: DC
  Administered 2016-01-26: 40 mg via INTRAVENOUS
  Filled 2016-01-26: qty 4

## 2016-01-26 MED ORDER — ASPIRIN EC 81 MG PO TBEC
81.0000 mg | DELAYED_RELEASE_TABLET | ORAL | Status: DC
  Administered 2016-01-27 – 2016-01-30 (×3): 81 mg via ORAL
  Filled 2016-01-26 (×3): qty 1

## 2016-01-26 MED ORDER — ACETAZOLAMIDE SODIUM 500 MG IJ SOLR
250.0000 mg | Freq: Four times a day (QID) | INTRAMUSCULAR | Status: AC
  Administered 2016-01-26 – 2016-01-27 (×3): 250 mg via INTRAVENOUS
  Filled 2016-01-26 (×3): qty 250

## 2016-01-26 MED ORDER — FUROSEMIDE 10 MG/ML IJ SOLN
40.0000 mg | Freq: Three times a day (TID) | INTRAMUSCULAR | Status: AC
  Administered 2016-01-26: 40 mg via INTRAVENOUS
  Filled 2016-01-26: qty 4

## 2016-01-26 MED ORDER — POTASSIUM CHLORIDE CRYS ER 20 MEQ PO TBCR
40.0000 meq | EXTENDED_RELEASE_TABLET | Freq: Three times a day (TID) | ORAL | Status: AC
  Administered 2016-01-26 (×2): 40 meq via ORAL
  Filled 2016-01-26 (×2): qty 2

## 2016-01-26 MED ORDER — INSULIN ASPART 100 UNIT/ML ~~LOC~~ SOLN
0.0000 [IU] | SUBCUTANEOUS | Status: DC
  Administered 2016-01-26: 20 [IU] via SUBCUTANEOUS
  Administered 2016-01-27: 7 [IU] via SUBCUTANEOUS
  Administered 2016-01-27 (×3): 4 [IU] via SUBCUTANEOUS
  Administered 2016-01-27: 11 [IU] via SUBCUTANEOUS
  Administered 2016-01-27 – 2016-01-28 (×2): 3 [IU] via SUBCUTANEOUS
  Administered 2016-01-29: 4 [IU] via SUBCUTANEOUS

## 2016-01-26 MED ORDER — ALBUTEROL SULFATE (2.5 MG/3ML) 0.083% IN NEBU: 3.0000 mL | INHALATION_SOLUTION | Freq: Four times a day (QID) | RESPIRATORY_TRACT | Status: DC | PRN

## 2016-01-26 NOTE — Progress Notes (Signed)
Patient ambulated 300 feet on 6L of oxygen with sats 88-90%. Patient had no complaints of shortness of breath and tolerated the walk well. His HR was 116-120 sinus tach during the walk. Patient returned to room and was placed on 5L o2 with sats 100% and HR 100. Will continue to monitor.

## 2016-01-26 NOTE — Progress Notes (Signed)
Inpatient Diabetes Program Recommendations  AACE/ADA: New Consensus Statement on Inpatient Glycemic Control (2015)  Target Ranges:  Prepandial:   less than 140 mg/dL      Peak postprandial:   less than 180 mg/dL (1-2 hours)      Critically ill patients:  140 - 180 mg/dL   Lab Results  Component Value Date   GLUCAP 211 (H) 01/26/2016    Review of Glycemic Control  Results for Zachary Brown, Zachary Brown (MRN [id number]) as of 01/26/2016 14:04  Ref. Range 01/25/2016 11:41 01/25/2016 17:46 01/25/2016 20:18 01/26/2016 07:53 01/26/2016 13:30  Glucose-Capillary Latest Ref Range: 65 - 99 mg/dL 174 (H) 944 (H) 967 (H) 189 (H) 211 (H)      Diabetes history:No prior hx  Current orders for Inpatient glycemic control: Novolog correction 0-9 units tid  *Solumedrol 60mg  q8h  Inpatient Diabetes Program Recommendations: Please consider increasing Novolog correction to moderate scale 0-15 units tid and adding Novolog 0-5 units qhs  Consider adding low dose basal insulin (fasting blood sugar 189mg /dl) 12 units qhs  Consider ordering an A1C   Consider changing diet to carb modified/heart healthy diet.   Susette Racer, RN, BA, MHA, CDE Diabetes Coordinator Inpatient Diabetes Program  [phone] (Team Pager) [phone] [facility] Office) 01/26/2016 2:17 PM

## 2016-01-26 NOTE — Progress Notes (Signed)
Report called to Misty Stanley, receiving RN on  2 west. VSS. Transferred to 2W04 via wheelchair with personal belongings.Patients family at bedside.  [facility]

## 2016-01-26 NOTE — Progress Notes (Deleted)
Name: Zachary Brown States MRN: [id number] DOB: [birth year]    ADMISSION DATE:  01/21/2016 CONSULTATION DATE: 01/22/16   REFERRING MD :  Karyl Kinnieraneville, MD  CHIEF COMPLAINT:  Bilateral infiltrates  BRIEF PATIENT DESCRIPTION: 65 yr old former smoker ( Quit 34 years ago) with  bilateral infiltrates. He received  5 days lasix and [facility] with  no improvement. [facility]/ work up 10/8   Studies 10/2 CT chest>>>bilateral infiltrates, no PE 10/5 VQ>>>int prob  10/7 CT chest>>>improving 10/8 [facility] 10[facility]12 Echo>> Normal LV systolic function; grade 1 diastolic dysfunction; mild   LVH; mild RAE and RVE; moderately reduced RV function. EF: 55-60%  Cultures: 01/22/2016>> Viral Panel Negative 01/22/2016>> MRSA Negative   HISTORY OF PRESENT ILLNESS:  [age] smoker with history of inhaling  some farming  foam?, presented with progressive SOB and leg swelling [place].  Did receive outpt ABX without improvement. [place] he with treatment lasix and steroids with slow progress. Had VQ int prob and Ctr chest neg PE but showed bilateral mosaic infiltrates. No fevers , no CP. Received [employer]  [place]. Had repeat CT with improved ILD. Remained hypoxic. [facility] but showed up without our knowledge on 10/8 in SDU. Was on BIPAP, but had NO increase WOB. Bipap dc'ed, O2 45% required. We were asked to intubate him to bronch th ept, which of course was not agreed upon. Remained culture neg. Echo showed normal EF%.  Showed mod PA htn and dilated IVC all c/w RV chronic overload vs acute on chronic.  SUBJECTIVE:  Continues to feel better, less short of breath. Walked in his room with assist last night.  VITAL SIGNS: Temp:  [97.5 F (36.4 C)-98.7 F (37.1 C)] 98.4 F (36.9 C) (10/13 0700) Pulse Rate:  [90-113] 96 (10/13 0700) Resp:  [16-33] 22 (10/13 0700) BP: (131-138)/(77-92) 131/85 (10/13 0700) SpO2:  [93 %-98 %] 98 % (10/13  0700) FiO2 (%):  [35 %] 35 % (10/12 1346) Weight:  [175 lb 8 oz (79.6 kg)] 175 lb 8 oz (79.6 kg) (10/13 11910621)  PHYSICAL EXAMINATION: General:  Awake, no distress,weaned to  5L [place] Neuro:  Nonfocal, perr HEENT:  jvd present Cardiovascular:  s1 s12 RRR no r/g Lungs:  Coarse and reduced, fewer crackles this am Abdomen:  Soft, BS wnl, no r Musculoskeletal:  Edema lowers 2+ Skin:  No rash, tenting, some areas of skin breakdown per lower extremities.   Recent Labs Lab 01/24/16 0355 01/25/16 0452 01/26/16 0347  NA 136 134* 136  K 3.7 3.5 4.3  CL 93* 97* 102  CO2 35* 30 27  BUN 22* 23* 21*  CREATININE 0.96 0.89 0.85  GLUCOSE 348* 368* 385*    Recent Labs Lab 01/22/16 0547 01/24/16 0355 01/25/16 0452  HGB 14.8 15.1 15.4  HCT 52.7* 52.0 51.4  WBC 7.0 7.4 8.7  PLT 100* 107* 114*   Immune Panel   Ref. Range 01/22/2016 05:47  ANA Ab, IFA Unknown Negative  ANCA Proteinase 3 Latest Ref Range: 0.0 - 3.5 U/mL <3.5  Myeloperoxidase Abs Latest Ref Range: 0.0 - 9.0 U/mL <9.0  RA Latex Turbid. Latest Ref Range: 0.0 - 13.9 IU/mL 10.0  Cytoplasmic (C-ANCA) Latest Ref Range: Neg:<1:20 titer <1:20  P-ANCA Latest Ref Range: Neg:<1:20 titer <1:20  Atypical P-ANCA titer Latest Ref Range: Neg:<1:20 titer <1:20  C3 Complement Latest Ref Range: 82 - 167 mg/dL 478117  Compl, Total (GN56CH50) Latest Ref Range:  42 - 60 U/mL >60 (H)  Complement C4, Body Fluid Latest Ref Range: 14 - 44 mg/dL 18    Ref. Range 01/23/2016 10:59  Homogeneous Pattern Unknown 1:320 (H)   No results found.  ASSESSMENT / PLAN:  Pulmonary:  A:  Hypoxic resp failure, chronic hypercapnic compensated resp failure ILD - likely Noninfectious, fibrosis PULM HTN R/o pulm edema component on top of chronic lung dz Unlikely infectious bacterial PNA Secondary polycythemia (hypoxia) P:  65 year old male who had no respiratory complaints 2 wks ago presenting now with acute hypoxemic respiratory failure with concern for  bronchiolitis vs atypical infection.  Failing to improve in amount of O2 demand.  On exam, diffuse crackles slightly improved in all lung fields.  CXR and CT with acute inflammation . Pt. Currently wishes DNI status.  Acute hypoxemic respiratory failure: etiology unknown.  - Titrate O2 for sat of 88-92%.  - Continue steroids  - Pt. Has been weaned to 5L [place] overnight ?ILD:  - Continue steroids.  - Would consider pirfenidone as outpatient as out patient.  - Auto-immune panel results as above  ? atypical infection  - Continue Levaquin  - F/U on culture  Acute pulmonary edema  - 2D echo : EF: 55-60%:Grade 1 diastolic dysfunction ( See above)  - BNP 225  Contraction alkalosis    - Lasix x 2 additional doses today  - BMET in am  - Replace electrolytes as indicated.  DNI status confirmed. Continues to improve clinically, requiring  less oxygen, ambulating with assist Transfer to Med - Surg Tele bed Ambulatory Desaturation Study Pulmonary and Cardiology Follow Up on D/C   Bevelyn Ngo, AGACNP-BC [facility] Pulmonary/Critical Care Medicine Pager # [phone] .01/26/2016   Alyson Reedy, M.D. [facility] Pulmonary/Critical Care Medicine. Pager: [phone]. After hours pager: [phone].

## 2016-01-26 NOTE — Progress Notes (Signed)
CRITICAL VALUE ALERT  Critical value received:  CBG 435  Date of notification:  01/26/16  Time of notification: 2030  Critical value read back:Yes.  by Nurse tech  Nurse who received alert:  Laury Axon  MD notified (1st page):  Paged Critical care team  Time of first page:  2045  Responding MD:  Cyril Mourning   Orders to give highest sliding scale  (20) insulin, q4h CBG checks and add Lantus 12 units

## 2016-01-26 NOTE — Progress Notes (Addendum)
Name: Zachary Brown MRN: [id number] DOB: Dec 28, 1950    ADMISSION DATE:  01/21/2016 CONSULTATION DATE: 01/22/16   REFERRING MD :  Zachary Kinnier, MD  CHIEF COMPLAINT:  Bilateral infiltrates  BRIEF PATIENT DESCRIPTION: 65 yr old former smoker ( Quit 34 years ago) with  bilateral infiltrates. He received  5 days lasix and steroids at [facility] with  no improvement. Transferred to [facility] for further treatment/ work up 10/8   Studies 10/2 CT chest>>>bilateral infiltrates, no PE 10/5 VQ>>>int prob  10/7 CT chest>>>improving 10/8 [facility]  Cultures: 01/22/2016>> Viral Panel Negative 01/22/2016>> MRSA Negative   HISTORY OF PRESENT ILLNESS:  22 smoker with history of inhaling  some farming  foam?, presented with progressive SOB and leg swelling to [place] 10./3.  Did receive outpt ABX without improvement. At [place] he with treatment lasix and steroids with slow progress. Had VQ int prob and Ctr chest neg PE but showed bilateral mosaic infiltrates. No fevers , no CP. Received [employer]. Had repeat CT with improved ILD. Remained hypoxic. [facility] but showed up without our knowledge on 10/8 in SDU. Was on BIPAP, but had NO increase WOB. Bipap dc'ed, O2 45% required. We were asked to intubate him to bronch th ept, which of course was not agreed upon. Remained culture neg. Echo showed normal EF%.  Showed mod PA htn and dilated IVC all c/w RV chronic overload vs acute on chronic.  SUBJECTIVE:  Patient states feeling better again this AM, eating and off HFNC, on 5L [place]  VITAL SIGNS: Temp:  [97.5 F (36.4 C)-98.7 F (37.1 C)] 98.4 F (36.9 C) (10/13 0700) Pulse Rate:  [90-113] 94 (10/13 0813) Resp:  [16-33] 25 (10/13 0813) BP: (131-138)/(77-96) 134/96 (10/13 0813) SpO2:  [93 %-100 %] 100 % (10/13 0813) FiO2 (%):  [35 %] 35 % (10/12 1346) Weight:  [79.6 kg (175 lb 8 oz)] 79.6 kg (175 lb 8 oz) (10/13 7829)  PHYSICAL EXAMINATION: General:  Awake, no  distress, on 5L [place] Neuro:  Awake and interactive, moving all ext to command HEENT:  [place]/AT, PERRL, EOM-I and MMM Cardiovascular:  RRR, Nl S1/S2, -M/R/G. Lungs:  Bibasilar crackles. Abdomen:  Soft, BS wnl, no r Musculoskeletal:  -edema now Skin:  No rash, tenting   Recent Labs Lab 01/24/16 0355 01/25/16 0452 01/26/16 0347  NA 136 134* 136  K 3.7 3.5 4.3  CL 93* 97* 102  CO2 35* 30 27  BUN 22* 23* 21*  CREATININE 0.96 0.89 0.85  GLUCOSE 348* 368* 385*    Recent Labs Lab 01/22/16 0547 01/24/16 0355 01/25/16 0452  HGB 14.8 15.1 15.4  HCT 52.7* 52.0 51.4  WBC 7.0 7.4 8.7  PLT 100* 107* 114*   No results found.  I reviewed CXR myself, improvement of infiltrate with diurses.  ASSESSMENT / PLAN:  Pulmonary:  A:  Hypoxic resp failure, chronic hypercapnic compensated resp failure ILD - likely Noninfectious, fibrosis PULM HTN R/o pulm edema component on top of chronic lung dz Unlikely infectious bacterial PNA Secondary polycythemia (hypoxia)  P:  65 year old male who had no respiratory complaints 2 wks ago presenting now with acute hypoxemic respiratory failure with concern for bronchiolitis vs atypical infection.  Failing to improve in amount of O2 demand.  On exam, diffuse crackles in all lung fields.  I reviewed CXR myself, improved aeration and CT with acute inflammation noted.  Discussed with PCCM-NP.  Patient wishes to be full code for now  but no prolonged respiratory support.  Acute hypoxemic respiratory failure: multifactorial with edema and ILD  - Titrate O2 for sat of 88-92%.  - Continue steroids  - D/C HFNC, no longer needed  ?ILD:  - Continue steroids.  - Would consider pirfenidone as outpatient but not right now.  - Auto-immune panel: CRP<0.8, ANA positive, ACE of 50 and mildly elevated complement (CH50 level), difficult to interpret this data.  ?atypical infection  - Levaquin as ordered, d/c after a total of 8 days.  - F/U on culture - NTD  Acute  pulmonary edema  - 2D echo with normal EF but grade 1 diastolic dysfunction.  - BNP 225  Contraction alkalosis  - Two additional doses of lasix  - Diamox for 3 doses.  - BMET in AM.  - Replace electrolytes as indicated.  DNI status confirmed.  Patient seen and examined, agree with above note.  I dictated the care and orders written for this patient under my direction.  Zachary ReedyWesam G Chariti Havel, MD [phone]

## 2016-01-26 NOTE — Progress Notes (Signed)
eLink Physician-Brief Progress Note Patient Name: Zachary Brown DOB: 01-19-1951 MRN: [id number]   Date of Service  01/26/2016  HPI/Events of Note  CBG 485 - on steroids On tid insulin scale  eICU Interventions  Add lantus 12 & change to resistant scale Titrate down as steroid dose lowered     Intervention Category Intermediate Interventions: Hyperglycemia - evaluation and treatment  ALVA,RAKESH V. 01/26/2016, 8:44 PM

## 2016-01-27 ENCOUNTER — Inpatient Hospital Stay (HOSPITAL_COMMUNITY): Payer: Medicaid Other

## 2016-01-27 LAB — CBC
HCT: 50.1 % (ref 39.0–52.0)
Hemoglobin: 15.1 g/dL (ref 13.0–17.0)
MCH: 24.4 pg — AB (ref 26.0–34.0)
MCHC: 30.1 g/dL (ref 30.0–36.0)
MCV: 80.8 fL (ref 78.0–100.0)
PLATELETS: 141 10*3/uL — AB (ref 150–400)
RBC: 6.2 MIL/uL — ABNORMAL HIGH (ref 4.22–5.81)
RDW: 17.4 % — ABNORMAL HIGH (ref 11.5–15.5)
WBC: 11.2 10*3/uL — ABNORMAL HIGH (ref 4.0–10.5)

## 2016-01-27 LAB — MAGNESIUM: MAGNESIUM: 1.9 mg/dL (ref 1.7–2.4)

## 2016-01-27 LAB — GLUCOSE, CAPILLARY
GLUCOSE-CAPILLARY: 143 mg/dL — AB (ref 65–99)
GLUCOSE-CAPILLARY: 158 mg/dL — AB (ref 65–99)
GLUCOSE-CAPILLARY: 287 mg/dL — AB (ref 65–99)
Glucose-Capillary: 160 mg/dL — ABNORMAL HIGH (ref 65–99)
Glucose-Capillary: 176 mg/dL — ABNORMAL HIGH (ref 65–99)
Glucose-Capillary: 210 mg/dL — ABNORMAL HIGH (ref 65–99)
Glucose-Capillary: 98 mg/dL (ref 65–99)

## 2016-01-27 LAB — BASIC METABOLIC PANEL
ANION GAP: 8 (ref 5–15)
BUN: 25 mg/dL — ABNORMAL HIGH (ref 6–20)
CHLORIDE: 100 mmol/L — AB (ref 101–111)
CO2: 31 mmol/L (ref 22–32)
Calcium: 8.6 mg/dL — ABNORMAL LOW (ref 8.9–10.3)
Creatinine, Ser: 0.82 mg/dL (ref 0.61–1.24)
GFR calc Af Amer: >60 mL/min (ref >60)
GLUCOSE: 162 mg/dL — AB (ref 65–99)
POTASSIUM: 4.6 mmol/L (ref 3.5–5.1)
SODIUM: 139 mmol/L (ref 135–145)

## 2016-01-27 LAB — PHOSPHORUS: Phosphorus: 2.4 mg/dL — ABNORMAL LOW (ref 2.5–4.6)

## 2016-01-27 MED ORDER — PREDNISONE 20 MG PO TABS
20.0000 mg | ORAL_TABLET | Freq: Two times a day (BID) | ORAL | Status: DC
  Administered 2016-01-27 – 2016-01-28 (×2): 20 mg via ORAL
  Filled 2016-01-27 (×2): qty 1

## 2016-01-27 NOTE — Progress Notes (Signed)
CM notified [facility] rep, Jermaine for charity home oxygen and [facility] DME rep, Reggie to please deliver the transportation tank.  CM met with pt and gave pt [place] letter with list of participating pharmacies and pt verbalized uynderstanding of all MATCH parameters. Pt has follow up appt at [facility] on 10/16 at 2:30 pm (on AVS).  No other Cm needs were communicated.

## 2016-01-27 NOTE — Progress Notes (Signed)
Name: Zachary Brown MRN: [id number] DOB: 19-Dec-1950    ADMISSION DATE:  01/21/2016 CONSULTATION DATE: 01/22/16   REFERRING MD :  Karyl Kinnier, MD  CHIEF COMPLAINT:  Bilateral infiltrates  BRIEF PATIENT DESCRIPTION: 65 yr old former smoker ( Quit 34 years ago) with  bilateral infiltrates. He received  5 days lasix and steroids at [facility] with  no improvement. Transferred to [facility] for further treatment/ work up 10/8   Studies 10/2 CT chest>>>bilateral infiltrates, no PE 10/5 VQ>>>int prob  10/7 CT chest>>>improving 10/8 [facility]  Cultures: 01/22/2016>> Viral Panel Negative 01/22/2016>> MRSA Negative   HISTORY OF PRESENT ILLNESS:  58 yowm smoker with history of inhaling  some farming  foam?, presented with progressive SOB and leg swelling to [facility] 10/3.  Did receive outpt ABX without improvement. At [place] he with treatment lasix and steroids with slow progress. Had VQ int prob and Ct chest neg PE but showed bilateral mosaic infiltrates. No fevers , no CP. Received Saverio [place]anker. Had repeat CT with improved ILD. Remained hypoxic. [facility] but showed up without our knowledge on 10/8 in SDU. Was on BIPAP, but had no increase WOB. Bipap dc'ed, O2 45% required.   Remained culture neg. Echo showed normal EF%.  Showed mod PH  and dilated IVC all c/w RV chronic overload vs acute on chronic.  SUBJECTIVE:   sitting on side of bed, wants to go home/ still on 3lpm NP - no cough or sob    VITAL SIGNS: Temp:  [97.4 F (36.3 C)-98.2 F (36.8 C)] 97.4 F (36.3 C) (10/14 0435) Pulse Rate:  [95-105] 95 (10/14 0435) Resp:  [20-25] 20 (10/14 0435) BP: (132-139)/(78-90) 133/82 (10/14 0435) SpO2:  [94 %-100 %] 96 % (10/14 0435) Weight:  [174 lb (78.9 kg)] 174 lb (78.9 kg) (10/14 0435)  PHYSICAL EXAMINATION: General:  Chronically ill appearing/ nad  Neuro:  Awake and interactive,   HEENT:  [place]/AT, PERRL, EOM-I and MMM Cardiovascular:  RRR, Nl S1/S2,  -M/R/G. Lungs:  Minimal dry Bibasilar crackles. Abdomen:  Soft, BS wnl, no r Musculoskeletal:  No edema/ clubbing  Skin:  No rash, tenting   Recent Labs Lab 01/25/16 0452 01/26/16 0347 01/27/16 0248  NA 134* 136 139  K 3.5 4.3 4.6  CL 97* 102 100*  CO2 30 27 31   BUN 23* 21* 25*  CREATININE 0.89 0.85 0.82  GLUCOSE 368* 385* 162*    Recent Labs Lab 01/24/16 0355 01/25/16 0452 01/27/16 0248  HGB 15.1 15.4 15.1  HCT 52.0 51.4 50.1  WBC 7.4 8.7 11.2*  PLT 107* 114* 141*   No results found.   ASSESSMENT / PLAN:  Pulmonary:  A:  Hypoxic resp failure, chronic hypercapnic compensated resp failure ILD - likely Noninfectious, fibrosis PULM HTN R/o pulm edema component on top of chronic lung dz Unlikely infectious bacterial PNA Secondary polycythemia (hypoxia)  P:  Acute hypoxemic respiratory failure: multifactorial with edema and ILD  - Titrate O2 for sat of 88-92%.  - Wean steroids to po 10/14      ?ILD:  - Wean  Steroids to po 10/14    - Auto-immune panel: CRP<0.8, ANA positive, ACE of 50 and mildly elevated complement (CH50 level)  Lab Results  Component Value Date   ESRSEDRATE 0 01/23/2016     ?atypical infection> no evidence of infection   - Levaquin rx here since 10/10  d/c after a total of 5 days planned = 10/15  Acute pulmonary edema  - 2D echo with normal EF but grade 1 diastolic dysfunction.     Contraction alkalosis  - Replace electrolytes as indicated s/p diamox rx .  DNI status noted   Pt's hx goes back months so this does not sound to me like a bacterial infection or aecopd pattern > d/c levaquin p 5 days (01/28/16) and consider d/c 10/15 to outpt f/u    Sandrea HughsMichael Wert, MD Pulmonary and Critical Care Medicine [facility] Cell [phone] After 5:30 PM or weekends, call [phone]

## 2016-01-27 NOTE — Progress Notes (Signed)
SATURATION QUALIFICATIONS: (This note is used to comply with regulatory documentation for home oxygen)  Patient Saturations on Room Air at Rest = 91-92%  Patient Saturations on Room Air while Ambulating = 86%  Patient Saturations on 4 Liters of oxygen while Ambulating = 90-91%  Please briefly explain why patient needs home oxygen: Pt's oxygen saturation dropped to 86% while ambulating on room air. Pt's oxygen saturation came up to 91% on 4 liters of oxygen, while ambulating.

## 2016-01-28 LAB — GLUCOSE, CAPILLARY
GLUCOSE-CAPILLARY: 99 mg/dL (ref 65–99)
GLUCOSE-CAPILLARY: 105 mg/dL — AB (ref 65–99)
GLUCOSE-CAPILLARY: 100 mg/dL — AB (ref 65–99)
Glucose-Capillary: 86 mg/dL (ref 65–99)
Glucose-Capillary: 127 mg/dL — ABNORMAL HIGH (ref 65–99)

## 2016-01-28 MED ORDER — PREDNISONE 20 MG PO TABS
20.0000 mg | ORAL_TABLET | Freq: Every day | ORAL | Status: DC
  Administered 2016-01-29 – 2016-01-30 (×2): 20 mg via ORAL
  Filled 2016-01-28 (×2): qty 1

## 2016-01-28 NOTE — Progress Notes (Signed)
Name: Zachary Brown MRN: [id number] DOB: 07/08/1950    ADMISSION DATE:  01/21/2016 CONSULTATION DATE: 01/22/16   REFERRING MD :  Karyl Kinnieraneville, MD  CHIEF COMPLAINT:  Bilateral infiltrates  BRIEF PATIENT DESCRIPTION: 65 yr old former smoker ( Quit 34 years ago) with  bilateral infiltrates. He received  5 days lasix and steroids at [facility] with  no improvement. Transfer[facility]/ work up 10/8   Studies 10/2 CT chest>>>bilateral infiltrates, no PE 10/5 VQ>>>int prob  10/7 CT chest>>>improving 10/8 [facility]  Cultures: 01/22/2016>> Viral Panel Negative 01/22/2016>> MRSA Negative   HISTORY OF PRESENT ILLNESS:  [age] yowm smoker with history of inhaling  some farming  foam?, presented with progressive SOB and leg swelling to [facility] 10[facility]3.  Did receive outpt ABX without improvement. At [facility] he with treatment lasix and steroids with slow progress. Had VQ int prob and Ct chest neg PE but showed bilateral mosaic infiltrates. No fevers , no CP. Received Saverio DankerZosyn [place]. Had repeat CT with improved ILD. Remained hypoxic. [facility] but showed up without our knowledge on 10/8 in SDU. Was on BIPAP, but had no increase WOB. Bipap dc'ed, O2 45% required.   Remained culture neg. Echo showed normal EF%.  Showed mod PH  and dilated IVC all c/w RV chronic overload vs acute on chronic.  SUBJECTIVE:   sitting on side of bed, wants to go home/ still on 3lpm NP - no cough or sob    VITAL SIGNS: Temp:  [97.9 F (36.6 C)-98.2 F (36.8 C)] 98.1 F (36.7 C) (10/15 0345) Pulse Rate:  [91-105] 91 (10/15 0345) Resp:  [18-20] 18 (10/15 0345) BP: (124-127)/(76-82) 127/82 (10/15 0345) SpO2:  [95 %-100 %] 100 % (10/15 0345) Weight:  [177 lb 6.4 oz (80.5 kg)] 177 lb 6.4 oz (80.5 kg) (10/15 0345)  PHYSICAL EXAMINATION: General:  Chronically ill appearing/ nad  Neuro:  Awake and interactive,   HEENT:  [place]/AT, PERRL, EOM-I and MMM Cardiovascular:  RRR,  Nl S1/S2, -M/R/G. Lungs:  Minimal dry Bibasilar crackles. Abdomen:  Soft, BS wnl, no r Musculoskeletal:  No edema/ clubbing  Skin:  No rash, tenting   Recent Labs Lab 01/25/16 0452 01/26/16 0347 01/27/16 0248  NA 134* 136 139  K 3.5 4.3 4.6  CL 97* 102 100*  CO2 30 27 31   BUN 23* 21* 25*  CREATININE 0.89 0.85 0.82  GLUCOSE 368* 385* 162*    Recent Labs Lab 01/24/16 0355 01/25/16 0452 01/27/16 0248  HGB 15.1 15.4 15.1  HCT 52.0 51.4 50.1  WBC 7.4 8.7 11.2*  PLT 107* 114* 141*   Dg Chest Port 1 View  Result Date: 01/27/2016 CLINICAL DATA:  Oxygen cannula.  Respiratory failure. EXAM: PORTABLE CHEST 1 VIEW COMPARISON:  01/24/2016 FINDINGS: 0600 hours. Diffuse interstitial and alveolar opacity persists without substantial interval change. The cardio pericardial silhouette is enlarged. The visualized bony structures of the thorax are intact. Telemetry leads overlie the chest. IMPRESSION: No substantial change in exam. Electronically Signed   By: Kennith CenterEric  Mansell M.D.   On: 01/27/2016 08:41     ASSESSMENT / PLAN:  Pulmonary:  A:  Hypoxic resp failure, chronic hypercapnic compensated resp failure ILD - likely Noninfectious, fibrosis PULM HTN R/o pulm edema component on top of chronic lung dz Unlikely infectious bacterial PNA Secondary polycythemia (hypoxia)  P: Acute hypoxemic respiratory failure: multifactorial with edema and ILD  - Titrate O2 for sat of 88-92%.  -  Weaned steroids to po 10/14      ?ILD:  - Wean  Steroids to po 10/14    - Auto-immune panel: CRP<0.8, ANA positive, ACE of 50 and mildly elevated complement (CH50 level)  Lab Results  Component Value Date   ESRSEDRATE 0 01/23/2016     ?atypical infection> no evidence of infection   - Levaquin rx here since 10/10  d/c after a total of 5 days planned = 10/15      Acute pulmonary edema  - 2D echo with normal EF but grade 1 diastolic dysfunction.     Contraction alkalosis  - Replace electrolytes  as indicated s/p diamox rx .  DNI status noted   Pt's hx goes back months so this does not sound to me like a bacterial infection or aecopd pattern > d/c levaquin p 5 days (01/28/16) and consider d/c 10/15 to outpt f/u    He is still 02 and insulin dependent and has neither at home nor does he have any training on injection / training for self management at home so will need case management prior to d/c to make sure he has what he needs and we can work on steroid taper in meantime    Sandrea Hughs, MD Pulmonary and Critical Care Medicine [facility] Cell [phone] After 5:30 PM or weekends, call [phone]

## 2016-01-29 ENCOUNTER — Inpatient Hospital Stay: Payer: Self-pay

## 2016-01-29 DIAGNOSIS — J9621 Acute and chronic respiratory failure with hypoxia: Principal | ICD-10-CM

## 2016-01-29 LAB — BASIC METABOLIC PANEL
ANION GAP: 9 (ref 5–15)
BUN: 28 mg/dL — ABNORMAL HIGH (ref 6–20)
CALCIUM: 8.6 mg/dL — AB (ref 8.9–10.3)
CO2: 25 mmol/L (ref 22–32)
Chloride: 101 mmol/L (ref 101–111)
Creatinine, Ser: 0.72 mg/dL (ref 0.61–1.24)
GFR calc non Af Amer: >60 mL/min (ref >60)
Glucose, Bld: 84 mg/dL (ref 65–99)
Potassium: 4.7 mmol/L (ref 3.5–5.1)
Sodium: 135 mmol/L (ref 135–145)

## 2016-01-29 LAB — GLUCOSE, CAPILLARY
GLUCOSE-CAPILLARY: 186 mg/dL — AB (ref 65–99)
GLUCOSE-CAPILLARY: 79 mg/dL (ref 65–99)
Glucose-Capillary: 113 mg/dL — ABNORMAL HIGH (ref 65–99)
Glucose-Capillary: 135 mg/dL — ABNORMAL HIGH (ref 65–99)

## 2016-01-29 MED ORDER — METFORMIN HCL 500 MG PO TABS
500.0000 mg | ORAL_TABLET | Freq: Two times a day (BID) | ORAL | Status: DC
  Administered 2016-01-29 – 2016-01-30 (×2): 500 mg via ORAL
  Filled 2016-01-29 (×2): qty 1

## 2016-01-29 MED ORDER — INSULIN GLARGINE 100 UNIT/ML ~~LOC~~ SOLN
8.0000 [IU] | Freq: Every day | SUBCUTANEOUS | Status: DC
  Filled 2016-01-29: qty 0.08

## 2016-01-29 MED ORDER — INSULIN ASPART 100 UNIT/ML ~~LOC~~ SOLN
0.0000 [IU] | Freq: Three times a day (TID) | SUBCUTANEOUS | Status: DC
  Administered 2016-01-29 – 2016-01-30 (×2): 3 [IU] via SUBCUTANEOUS
  Administered 2016-01-30: 4 [IU] via SUBCUTANEOUS

## 2016-01-29 NOTE — Progress Notes (Signed)
Inpatient Diabetes Program Recommendations  AACE/ADA: New Consensus Statement on Inpatient Glycemic Control (2015)  Target Ranges:  Prepandial:   less than 140 mg/dL      Peak postprandial:   less than 180 mg/dL (1-2 hours)      Critically ill patients:  140 - 180 mg/dL   Lab Results  Component Value Date   GLUCAP 186 (H) 01/29/2016    Review of Glycemic ControlResults for SAVAN, CARREAU (MRN [id number]) as of 01/29/2016 14:33  Ref. Range 01/28/2016 11:18 01/28/2016 16:44 01/28/2016 21:57 01/29/2016 06:06 01/29/2016 11:19  Glucose-Capillary Latest Ref Range: 65 - 99 mg/dL 388 (H) 828 (H) 003 (H) 113 (H) 186 (H)    Diabetes history: None noted Outpatient Diabetes medications:  None Current orders for Inpatient glycemic control:  Novolog resistant q 4 hours, Lantus 12 units q HS  Inpatient Diabetes Program Recommendations:    Discussed with Dr. Christene Slates- Patient does not have previous history of diabetes however blood sugars increased with steroids.  CBG's now improved.  Discussed with MD.  Orders received including A1C, d/c of Lantus, and change of correction to tid with meals.  MD also started Metformin 500 mg bid.  Will follow. Unclear if this is new onset diabetes or if blood sugars are elevated from steroids?    Thanks, Beryl Meager, RN, BC-ADM Inpatient Diabetes Coordinator Pager [phone] (8a-5p)

## 2016-01-29 NOTE — Progress Notes (Signed)
Order for patient for bipap PRN.  Patient currently in no distress.  Bipap not needed at this time.  Will continue to monitor.

## 2016-01-29 NOTE — Care Management Note (Signed)
Case Management Note Previous CM note initiated by Leone Haven, RN 01/23/2016, 4:54 PM   Patient Details  Name: Zachary Brown MRN: [id number] Date of Birth: Feb 25, 1951  Subjective/Objective:  Patient lives with wife, pta indep, lives in [place], he went to urgent care before coming to hospital, he hs no PCP, would like to go to our [facility] clinic. He has apt scheduled for 10/16 at 2 :30.  He has transportation at discharge.  He is not on home oxygen.  NCM will cont to follow for dc needs.     01/24/16 Letha Cape RN, NCM- patient most likely will need home oxygen, patient states he would like to work with [facility], made referral to [facility] with [facility]  For charity for home oxygen, patient has no insurance.  He has chronic hypercapnic compensated resp failure.             Action/Plan:   Expected Discharge Date:     01/29/16             Expected Discharge Plan:  Home/Self Care  In-House Referral:     Discharge planning Services  CM Consult, Medication Assistance, Indigent Health Clinic, [facility]  Post Acute Care Choice:    Choice offered to:  Patient  DME Arranged:  Oxygen DME Agency:  [facility]  HH Arranged:  NA HH Agency:  NA  Status of Service:  Completed, signed off  If discussed at Long Length of Stay Meetings, dates discussed:    Additional Comments:  01/29/16- 1000- Lakethia Coppess RN, CM- per weekend CM note-  home 02 tanks delivered by [facility]- and has been given [facility] letter for medication assistance- no further CM needs noted.   Darrold Span, RN 01/29/2016, 10:05 AM

## 2016-01-29 NOTE — Progress Notes (Addendum)
Name: Almond Lintony Ray Manter MRN: [id number] DOB: [birth year]    ADMISSION DATE:  01/21/2016 CONSULTATION DATE: 01/22/16   REFERRING MD :  Karyl Kinnieraneville, MD  CHIEF COMPLAINT:  Bilateral infiltrates  BRIEF PATIENT DESCRIPTION: [age] yowm smoker with history of inhaling  some farming  foam?, presented with progressive SOB and leg swelling to [facility] 10/3.  Did receive outpt ABX without improvement. At [facility] he with treatment lasix and steroids with slow progress. Had VQ int prob and Ct chest neg PE but showed bilateral mosaic infiltrates. No fevers , no CP. Received Saverio DankerZosyn [place]. Had repeat CT with improved ILD. Remained hypoxic. [facility] but showed up without our knowledge on 10/8 in SDU. Was on BIPAP, but had no increase WOB. Bipap dc'ed, O2 45% required.   Remained culture neg. Echo showed normal EF%.  Showed mod PH  and dilated IVC all c/w RV chronic overload vs acute on chronic.  Studies 10/2 CT chest>>>bilateral infiltrates, no PE 10/5 VQ>>>int prob  10/7 CT chest>>>improving 10/8 [facility]  Cultures: 01/22/2016>> Viral Panel Negative 01/22/2016>> MRSA Negative    SUBJECTIVE:  Remains on 3L [place].    VITAL SIGNS: Temp:  [97.8 F (36.6 C)-97.9 F (36.6 C)] 97.9 F (36.6 C) (10/16 0500) Pulse Rate:  [89-94] 89 (10/16 0500) Resp:  [18] 18 (10/16 0500) BP: (120-136)/(74-92) 136/92 (10/16 0500) SpO2:  [97 %-99 %] 98 % (10/16 0500) Weight:  [80.9 kg (178 lb 6.4 oz)] 80.9 kg (178 lb 6.4 oz) (10/16 0451)  PHYSICAL EXAMINATION: General:  Chronically ill appearing/ nad  Neuro:  Awake and interactive,   HEENT:  [place]/AT, PERRL, EOM-I and MMM Cardiovascular:  RRR, Nl S1/S2, -M/R/G. Lungs:  Minimal dry Bibasilar crackles. Abdomen:  Soft, BS wnl, no r Musculoskeletal:  No edema/ clubbing  Skin:  No rash, tenting   Recent Labs Lab 01/25/16 0452 01/26/16 0347 01/27/16 0248  NA 134* 136 139  K 3.5 4.3 4.6  CL 97* 102 100*  CO2 30 27 31   BUN 23* 21* 25*    CREATININE 0.89 0.85 0.82  GLUCOSE 368* 385* 162*    Recent Labs Lab 01/24/16 0355 01/25/16 0452 01/27/16 0248  HGB 15.1 15.4 15.1  HCT 52.0 51.4 50.1  WBC 7.4 8.7 11.2*  PLT 107* 114* 141*   No results found.   ASSESSMENT / PLAN:  Pulmonary:  A:  Hypoxic resp failure, chronic hypercapnic compensated resp failure ILD - likely Noninfectious, fibrosis PULM HTN R/o pulm edema component on top of chronic lung dz Unlikely infectious bacterial PNA Secondary polycythemia (hypoxia)  P: Acute hypoxemic respiratory failure: multifactorial with edema and ILD  - Titrate O2 for sat of 88-92%.  - Weaned steroids to po 10/14      ?ILD:  - Wean  Steroids to po 10/14    - Auto-immune panel: CRP<0.8, ANA positive, ACE of 50 and mildly elevated complement (CH50 level)    ?atypical infection> no evidence of infection   - Levaquin rx here since 10/10  d/c after a total of 5 days planned = 10/15      Acute pulmonary edema  - 2D echo with normal EF but grade 1 diastolic dysfunction.    DM  - new diagnosis. Pt does NOT have insurance. Will switch Lantus to PO metformin assuming his creat and BMP are     Normal.  wil consult DM teaching re: education.    Contraction alkalosis  - Replace electrolytes as indicated s/p diamox  rx .  DNI status noted    Pt's hx goes back months so this does not sound to me like a bacterial infection or aecopd pattern > d/c levaquin p 5 days (01/28/16) and consider d/c 10/15 to outpt f/u    He is still 02 and insulin dependent and has neither at home nor does he have any training on injection / training for self management at home so will need case management prior to d/c to make sure he has what he needs and we can work on steroid taper in meantime   Plan d/w pt.    Dirk Dress, NP 01/29/2016  9:27 AM Pager: [phone] or [phone]   J. Alexis Frock, MD 01/29/2016, 2:37 PM [place] Pulmonary and Critical Care Pager [phone]  [phone] After 3 pm or if no answer, call [phone]

## 2016-01-30 DIAGNOSIS — E119 Type 2 diabetes mellitus without complications: Secondary | ICD-10-CM

## 2016-01-30 DIAGNOSIS — J439 Emphysema, unspecified: Secondary | ICD-10-CM

## 2016-01-30 LAB — GLUCOSE, CAPILLARY
GLUCOSE-CAPILLARY: 137 mg/dL — AB (ref 65–99)
Glucose-Capillary: 160 mg/dL — ABNORMAL HIGH (ref 65–99)

## 2016-01-30 LAB — HEMOGLOBIN A1C
Hgb A1c MFr Bld: 8 % — ABNORMAL HIGH (ref 4.8–5.6)
Mean Plasma Glucose: 183 mg/dL

## 2016-01-30 MED ORDER — PNEUMOCOCCAL VAC POLYVALENT 25 MCG/0.5ML IJ INJ: 0.5000 mL | INJECTION | Freq: Once | INTRAMUSCULAR | 0 refills | Status: AC

## 2016-01-30 MED ORDER — METFORMIN HCL 500 MG PO TABS: 500.0000 mg | ORAL_TABLET | Freq: Two times a day (BID) | ORAL | 2 refills | Status: DC

## 2016-01-30 MED ORDER — PREDNISONE 10 MG PO TABS: ORAL_TABLET | ORAL | 0 refills | Status: DC

## 2016-01-30 MED ORDER — LIVING WELL WITH DIABETES BOOK: 1.0000 | Freq: Once | 0 refills | Status: AC

## 2016-01-30 MED ORDER — INFLUENZA VAC SPLIT QUAD 0.5 ML IM SUSY: 0.5000 mL | PREFILLED_SYRINGE | Freq: Once | INTRAMUSCULAR | 0 refills | Status: AC

## 2016-01-30 MED ORDER — LIVING WELL WITH DIABETES BOOK
Freq: Once | Status: DC
  Filled 2016-01-30: qty 1

## 2016-01-30 NOTE — Progress Notes (Signed)
Inpatient Diabetes Program Recommendations  AACE/ADA: New Consensus Statement on Inpatient Glycemic Control (2015)  Target Ranges:  Prepandial:   less than 140 mg/dL      Peak postprandial:   less than 180 mg/dL (1-2 hours)      Critically ill patients:  140 - 180 mg/dL   Lab Results  Component Value Date   GLUCAP 137 (H) 01/30/2016   HGBA1C 8.0 (H) 01/29/2016    Spoke with patient regarding new onset diabetes.  Discussed normal glucose levels, monitoring, symptoms of high and low and the importance of follow-up with PCP.  He already has an appointment scheduled with PCP.  Dicussed lifestyle modifications including diet and exercise.  He loves vegetables and has a garden.  Discussed the effects of steroids on blood sugar levels as well.  Overall expect good compliance.  Survival skills reviewed.    Thanks, Beryl Meager, RN, BC-ADM Inpatient Diabetes Coordinator Pager [phone] (8a-5p)

## 2016-01-30 NOTE — Progress Notes (Addendum)
Discussed with the patient and all questioned fully answered. He will call me if any problems arise. Pt daughter at bedside. Pt given paper prescriptions. Pt had already scheduled appt with PCP. Pt verbalized understanding of follow up with pulmonology. Pt discharged with 2 oxygen tanks  Pt declined flu and pneumonia vaccines prior to discharge. Pt said he would follow up with his primary care physician. Daughter aware. Leonidas Romberg, RN

## 2016-01-30 NOTE — Discharge Summary (Signed)
Physician Discharge Summary       Patient ID: Almond Lintony Ray Shur MRN: [id number] DOB/AGE: [birth year] 65 y.o.  Admit date: 01/21/2016 Discharge date: 01/30/2016  Discharge Diagnoses:  Active Problems:   Acute hypoxemic respiratory failure (HCC)   Hypoxia   ILD (interstitial lung disease) (HCC)   Acute on chronic respiratory failure with hypoxia (HCC)   Diabetes (HCC)   History of Present Illness: [age] yowm smoker with history of inhaling  some farming  foam?, presented with progressive SOB and leg swelling to [facility] 10/3.  Did receive outpt ABX without improvement. At [facility] he with treatment lasix and steroids with slow progress. Had VQ int prob and Ct chest neg PE but showed bilateral mosaic infiltrates. No fevers , no CP. Received Saverio DankerZosyn [place]. Had repeat CT with improved ILD. Remained hypoxic. [facility] but showed up without our knowledge on 10/8 in SDU. Was on BIPAP, but had no increase WOB. Bipap dc'ed, O2 45% required.   Remained culture neg. Echo showed normal EF%.  Showed mod PH  and dilated IVC all c/w RV chronic overload vs acute on chronic.  Hospital Course:  Pt. Has slowly progressed from High flow oxygen to 3L [place]. He was treated with Prednisone and  Diuretics and is negative 15 L since admission. He was treated with Levaquin x 5 days . Cultures were negative and it was d/c'd.. Echo shows normal EF but grade 1 diastolic dysfunction. He will go home with oxygen.He will need further pulmonary out patient work up for ILD, Pulmonary HTN, chronic lung disease.  Blood sugars have been elevated since admission. There was consideration that this was due to prednisone , but HGB  A1C was 8.0, and pt. was transitioned from Lantus  and sliding scale insulin to Metformin . He will go home with Q AM glucose monitoring. Diabetes management is teaching and assisting with supplies.  Studies 10/2 CT chest>>>bilateral infiltrates, no PE 10/5 VQ>>>int prob  10/7 CT  chest>>>improving 10/8 [facility]  Cultures: 01/22/2016>> Viral Panel Negative 01/22/2016>> MRSA Negative   Auto-immune panel: CRP<0.8, ANA positive, ACE of 50 and mildly elevated complement (CH50 level)    Discharge Plan by active problems   Pulmonary A:  chronic hypercapnic compensated resp failure ILD - likely Noninfectious, fibrosis PULM HTN R/o pulm edema component on top of chronic lung dz Atypical Infection>> No evidence of infection Acute pulmonary edema>> Diuresed to 15 L negative Secondary polycythemia (hypoxia) Tolerating O2 at 3 L [place]  Plan:  Discharge home with oxygen at 3L until seen by Pulmonary 02/05/16. Goal oxygen saturation 88-92% Prednisone Taper as prescribed Consider Cardiology consult as outpatient Further work up on an outpatient basis for ILD/PULM HTN/ Chronic Lung Disease Pro Air Inhaler at discharge : Use for shortness of breath or wheezing as needed    Diabetes A: New Diagnosis this admission HGB A1C>> 8.0  P: Home on Metformin 500 mg BID Check CBG on home monitor fasting Q am Diabetes Coordinator to teach and arrange for monitor device  Contraction Alkalosis: Resolved  Consults : None  Discharge Exam: BP 126/82 (BP Location: Left Arm)   Pulse 91   Temp 97.8 F (36.6 C) (Oral)   Resp 18   Ht 5\' 9"  (1.753 m)   Wt 180 lb 1.9 oz (81.7 kg)   SpO2 99%   BMI 26.60 kg/m    PHYSICAL EXAMINATION: General:  Chronically ill appearing/ nad/ on 3L [place] , ambulating in room Neuro:  Awake and interactive,   HEENT:  [place]/AT, PERRL, EOM-I and MMM Cardiovascular:  RRR, Nl S1/S2, -M/R/G. Lungs:  Minimal dry Bibasilar crackles. Abdomen:  Soft, BS wnl, no r Musculoskeletal:  No edema/ clubbing  Skin:  No rash, tenting   Labs at discharge Lab Results  Component Value Date   CREATININE 0.72 01/29/2016   BUN 28 (H) 01/29/2016   NA 135 01/29/2016   K 4.7 01/29/2016   CL 101 01/29/2016   CO2 25 01/29/2016   Lab Results  Component  Value Date   WBC 11.2 (H) 01/27/2016   HGB 15.1 01/27/2016   HCT 50.1 01/27/2016   MCV 80.8 01/27/2016   PLT 141 (L) 01/27/2016   Lab Results  Component Value Date   ALT 21 01/21/2016   AST 25 01/21/2016   ALKPHOS 48 01/21/2016   BILITOT 1.2 01/21/2016   Lab Results  Component Value Date   INR 1.16 01/22/2016    Current radiology studies No results found.  Disposition:  Final discharge disposition not confirmed  Discharge Instructions    Call MD for:  difficulty breathing, headache or visual disturbances    Complete by:  As directed    Call MD for:  extreme fatigue    Complete by:  As directed    Call MD for:  persistant dizziness or light-headedness    Complete by:  As directed    Call MD for:  temperature >100.4    Complete by:  As directed    Diet - low sodium heart healthy    Complete by:  As directed    Discharge instructions    Complete by:  As directed    Please advance activity  Slowly. [facility] will be providing oxygen to your home. Wear your oxygen at 2L [place] day and night as you have been doing in the hospital until your follow up appointment with Pulmonary 02/05/2016 at 4 pm. You need to establish care with a Primary Care MD before discharge.  You have long term health issues that will require long term care and follow up. Take your medications as prescribed per your discharge paperwork. You will need to be seen by your Primary Care Physician within 1 week of discharge. Metformin is a new medication that you will take twice daily for your diabetes . This is a new diagnosis for you. Please check your blood sugars each morning before you eat. The Diabetes co-ordinator will teach you how to do this, and she will give you information on where to buy your meter. Your Follow Up appointment with Pulmonary is 02/05/2016 at 4 pm with Rubye Oaks, NP Please contact office for sooner follow up if symptoms do not improve or worsen or seek emergency care    Increase activity slowly    Complete by:  As directed        Medication List    STOP taking these medications   PROBIOTIC PO     TAKE these medications   albuterol 108 (90 Base) MCG/ACT inhaler Commonly known as:  PROVENTIL HFA;VENTOLIN HFA Inhale 1 puff into the lungs every 6 (six) hours as needed for wheezing or shortness of breath.   aspirin EC 81 MG tablet Take 81 mg by mouth 4 (four) times a week.   Influenza vac split quadrivalent PF 0.5 ML injection Commonly known as:  FLUARIX Inject 0.5 mLs into the muscle once.   living well with diabetes book Misc 1 each by Does not apply route once.  metFORMIN 500 MG tablet Commonly known as:  GLUCOPHAGE Take 1 tablet (500 mg total) by mouth 2 (two) times daily with a meal.   pneumococcal 23 valent vaccine 25 MCG/0.5ML injection Commonly known as:  PNU-IMMUNE Inject 0.5 mLs into the muscle once.   predniSONE 10 MG tablet Commonly known as:  DELTASONE Take 4 tabs x 2 days, then 3 x 2d, then 2 x 2d, then 1 x 2d and STOP      Follow-up Information    [facility] Follow up on 01/29/2016.   Why:  2:30 for hospital follow up Contact information: [address] [place] [address] [phone]       Inc[facility] - Dme [facility] .   Why:  Home 02 has been arranged Contact information: [address] [place] [address] [phone]        Rubye Oaks, NP Follow up on 02/05/2016.   Specialty:  Pulmonary Disease Why:  4:00  pm with TammyParrett ANP-BC 10/23 Contact information: [address] [place] [address] [phone]           Discharged Condition: good  Office Follow Up Special Consideration: CBG BMET PFT's in the future Continued work up for ILD, PULM HTN, Chronic Lung Disease Consider Pulmonary Rehab at [facility] established with PCP for continued management of DM and need for diuresis/ Grade 1 diastolic  dysfunction. Review home blood sugars Consider cardiology referral  Greater than 35 minutes of time have been dedicated to discharge assessment, planning and discharge instructions.   Signed: Bevelyn Ngo, AGACNP-BC [facility] Pulmonary/Critical Care Medicine Pager # [phone] 01/30/2016   Physician Discharge Summary    Discharged Condition: stable  Physician Statement:   The Patient was personally examined, the discharge assessment and plan has been personally reviewed and I agree with Kandice Robinsons  assessment and plan. > 30 minutes of time have been dedicated to discharge assessment, planning and discharge instructions.    I discussed with pt and his daughter re: COPD, possible ILD, and new Dx of DM. Needs close follow up.  COnt o2 for now. Slow wean of prednisone.  Signed:  J. Eddie Candle de Dios 01/30/2016, 11:56 AM [place] [facility] Pager [phone] After 3 pm or if no answer, call [phone]

## 2016-02-05 ENCOUNTER — Encounter: Payer: Self-pay | Admitting: Adult Health

## 2016-02-05 ENCOUNTER — Ambulatory Visit (INDEPENDENT_AMBULATORY_CARE_PROVIDER_SITE_OTHER): Payer: Medicaid Other | Admitting: Adult Health

## 2016-02-05 ENCOUNTER — Ambulatory Visit (INDEPENDENT_AMBULATORY_CARE_PROVIDER_SITE_OTHER)
Admission: RE | Admit: 2016-02-05 | Discharge: 2016-02-05 | Disposition: A | Discharge status: Home / Self Care | Payer: Medicaid Other | Source: Ambulatory Visit | Attending: Adult Health | Admitting: Adult Health

## 2016-02-05 VITALS — HR 96 | Temp 98.0°F | Ht 69.0 in | Wt 180.0 lb

## 2016-02-05 DIAGNOSIS — J849 Interstitial pulmonary disease, unspecified: Secondary | ICD-10-CM

## 2016-02-05 NOTE — Assessment & Plan Note (Signed)
ILD changes on CT chest +/- pulmonary edema/PNA ? Etiology , Pulm HTN  Autoimmune with ANA positive /ACE mild elevated.  He is improved with steroids and diuresis .  Needs follow up cxr today .  Check PFT on return  Continue on O2 .  Will consider rheumatology on return or repeat ANA and ACE on return off steroids .   Plan  Patient Instructions  Finish prednisone as directed.  Continue on Oxygen 2l/m . Keep oxygen level >88-90%.  Chest xray today .  Follow up in 3 weeks with Dr. Christene Slates with PFT .

## 2016-02-05 NOTE — Progress Notes (Signed)
Subjective:    Patient ID: Zachary Brown, male    DOB: [birth year], 65 y.o.   MRN: [id number]  HPI 65 yo male former smoker seen for pulmonary consult 01/21/16    TEST  Studies 10/2 CT chest>>>bilateral infiltrates, no PE 10/5 VQ>>>int prob  10/7 CT chest>>>improving 10/8 [facility]  Cultures: 01/22/2016>> Viral Panel Negative 01/22/2016>> MRSA Negative   Auto-immune panel: CRP<0.8, ANA positive, ACE of 50 and mildly elevated complement (CH50 level)   02/05/2016 Post Hospital follow up  Returns for a post hospital follow-up. Patient was admitted to [facility] on October 3 for progressive shortness of breath and lower extremity swelling. He was transferred October 8 to [facility] for acute hypoxic respiratory failure. Patient was found to have extensive bilateral infiltrates. Negative for pulmonary embolism. He was treated with IV antibiotics. Did have some fluid overload with acute pulmonary edema and required aggressive diuresis. Autoimmune workup showed ANA positive, ace at 50. And mildly elevated complement.Marland Kitchen He was treated with slow prednisone taper. He has 2 additional days left.Marland Kitchen He was discharged on oxygen at 3 L. . 2-D echo showed an EF of 55-60%, grade 1 diastolic dysfunction, mildly dilated right ventricle, decreased right ventricle systolic function.  Follow-up CT chest on October 7 did show improvement with decreased bilateral infiltrates. Viral panel was negative. Since discharge. Patient says he is significantly improved with decreased shortness of breath.   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  Progressive DOE since 06/2015 with associated leg swelling.   He denies any chest pain, orthopnea, PND, nausea, vomiting or diarrhea.  Past Medical History:  Diagnosis Date  . Pneumonia 1956   severe  . Snake bite 1980   copper heaed   Current Outpatient Prescriptions on File Prior to Visit  Medication Sig  Dispense Refill  . albuterol (PROVENTIL HFA;VENTOLIN HFA) 108 (90 Base) MCG/ACT inhaler Inhale 1 puff into the lungs every 6 (six) hours as needed for wheezing or shortness of breath.    Marland Kitchen aspirin EC 81 MG tablet Take 81 mg by mouth 4 (four) times a week.     . metFORMIN (GLUCOPHAGE) 500 MG tablet Take 1 tablet (500 mg total) by mouth 2 (two) times daily with a meal. 60 tablet 2  . predniSONE (DELTASONE) 10 MG tablet Take 4 tabs x 2 days, then 3 x 2d, then 2 x 2d, then 1 x 2d and STOP 20 tablet 0   No current facility-administered medications on file prior to visit.       Review of Systems Constitutional:   No  weight loss, night sweats,  Fevers, chills,  +fatigue, or  lassitude.  HEENT:   No headaches,  Difficulty swallowing,  Tooth/dental problems, or  Sore throat,                No sneezing, itching, ear ache, nasal congestion, post nasal drip,   CV:  No chest pain,  Orthopnea, PND, , anasarca, dizziness, palpitations, syncope.   GI  No heartburn, indigestion, abdominal pain, nausea, vomiting, diarrhea, change in bowel habits, loss of appetite, bloody stools.   Resp:    No chest wall deformity  Skin: no rash or lesions.  GU: no dysuria, change in color of urine, no urgency or frequency.  No flank pain, no hematuria   MS:  No joint pain or swelling.  No decreased range of motion.  No back pain.  Psych:  No change in mood  or affect. No depression or anxiety.  No memory loss.         Objective:   Physical Exam Vitals:   02/05/16 1546 02/05/16 1547  Pulse:  96  Temp: 98 F (36.7 C)   TempSrc: Oral   SpO2:  95%  Weight: 180 lb (81.6 kg)   Height: 5\' 9"  (1.753 m)    GEN: A/Ox3; pleasant , NAD, elderly    HEENT:  [place]/AT,  EACs-clear, TMs-wnl, NOSE-clear, THROAT-clear, no lesions, no postnasal drip or exudate noted.   NECK:  Supple w/ fair ROM; no JVD; normal carotid impulses w/o bruits; no thyromegaly or nodules palpated; no lymphadenopathy.    RESP  Clear  P & A;  w/o, wheezes/ rales/ or rhonchi. no accessory muscle use, no dullness to percussion  CARD:  RRR, no m/r/g  , 1+ peripheral edema, pulses intact, no cyanosis or clubbing.  GI:   Soft & nt; nml bowel sounds; no organomegaly or masses detected.   Musco: Warm bil, no deformities or joint swelling noted.   Neuro: alert, no focal deficits noted.    Skin: Warm, no lesions or rashes  Elenna Spratling NP-C  [facility]  02/05/2016        Assessment & Plan:

## 2016-02-05 NOTE — Patient Instructions (Signed)
Finish prednisone as directed.  Continue on Oxygen 2l/m . Keep oxygen level >88-90%.  Chest xray today .  Follow up in 3 weeks with Dr. Christene Slates with PFT .

## 2016-02-26 ENCOUNTER — Ambulatory Visit (HOSPITAL_COMMUNITY)
Admission: RE | Admit: 2016-02-26 | Discharge: 2016-02-26 | Disposition: A | Discharge status: Home / Self Care | Payer: Self-pay | Source: Ambulatory Visit | Attending: Adult Health | Admitting: Adult Health

## 2016-02-26 DIAGNOSIS — J849 Interstitial pulmonary disease, unspecified: Secondary | ICD-10-CM | POA: Insufficient documentation

## 2016-02-26 LAB — PULMONARY FUNCTION TEST
DL/VA % pred: 40 %
DL/VA: 1.86 ml/min/mmHg/L
DLCO unc % pred: 22 %
DLCO unc: 6.98 ml/min/mmHg
FEF 25-75 PRE: 2.7 L/s
FEF 25-75 Post: 3.66 L/sec
FEF2575-%CHANGE-POST: 35 %
FEF2575-%PRED-POST: 136 %
FEF2575-%Pred-Pre: 101 %
FEV1-%CHANGE-POST: 2 %
FEV1-%PRED-POST: 75 %
FEV1-%PRED-PRE: 73 %
FEV1-POST: 2.51 L
FEV1-PRE: 2.45 L
FEV1FVC-%Change-Post: 0 %
FEV1FVC-%PRED-PRE: 115 %
FEV6-%Change-Post: 1 %
FEV6-%PRED-POST: 68 %
FEV6-%Pred-Pre: 66 %
FEV6-POST: 2.89 L
FEV6-PRE: 2.84 L
FEV6FVC-%PRED-PRE: 105 %
FEV6FVC-%Pred-Post: 105 %
FVC-%CHANGE-POST: 1 %
FVC-%PRED-POST: 64 %
FVC-%PRED-PRE: 63 %
FVC-POST: 2.89 L
FVC-PRE: 2.84 L
POST FEV1/FVC RATIO: 87 %
PRE FEV6/FVC RATIO: 100 %
Post FEV6/FVC ratio: 100 %
Pre FEV1/FVC ratio: 86 %
RV % PRED: 74 %
RV: 1.68 L
TLC % pred: 68 %
TLC: 4.65 L

## 2016-02-26 MED ORDER — ALBUTEROL SULFATE (2.5 MG/3ML) 0.083% IN NEBU
2.5000 mg | INHALATION_SOLUTION | Freq: Once | RESPIRATORY_TRACT | Status: AC
Start: 2016-02-26 — End: 2016-02-26
  Administered 2016-02-26: 2.5 mg via RESPIRATORY_TRACT

## 2016-02-29 ENCOUNTER — Ambulatory Visit (INDEPENDENT_AMBULATORY_CARE_PROVIDER_SITE_OTHER): Payer: Self-pay | Admitting: Pulmonary Disease

## 2016-02-29 ENCOUNTER — Encounter: Payer: Self-pay | Admitting: Pulmonary Disease

## 2016-02-29 DIAGNOSIS — R0902 Hypoxemia: Secondary | ICD-10-CM

## 2016-02-29 DIAGNOSIS — J849 Interstitial pulmonary disease, unspecified: Secondary | ICD-10-CM

## 2016-02-29 NOTE — Assessment & Plan Note (Addendum)
Nonsmoker. Patient admitted in October 2017 for acute on chronic exertional dyspnea. He had hypoxemic respiratory failure secondary to diffuse lung infiltrates/ILD/pneumonia. He improved with Levaquin, diuretics, prednisone, BiPAP. He was discharged significantly improved. He states he is better than baseline.  Chest CT scan October 2017 with bilateral infiltrates, no effusion. He had a chest x-ray and of October which showed significant improvement in the infiltrates.   PFT 02/2016 >FEV175%, ratio 87, FVC 64%, no sign BD response, TLC 68%, DLCO 22%.   As mentioned, he states he is better than baseline. He is able to check his O2 saturation is usually more than 88% on room air with exertion. Uses albuterol sparingly.  Unfortunately, he does not have any insurance. He is working on getting Medicaid or disability.  Plan : 1. I advised the patient to give [place] a call once he has Medicaid or any type of insurance. At that point, he will need a repeat chest CT scan (HRCT) as well as ABG. ABG because of the low diffusion capacity based on PFT. He will also need a repeat CBC, BMET just to make sure there are no underlying issues causing low diffusion capacity. He will also need a repeat 2-D echo. Echo done in October showed decreased RV function, RV pressure and volume overload. He will also need a sleep study to check for sleep apnea, need for oxygen at bedtime. I extensively discussed the plan with the patient and he is in agreement. 2. Patient received a flu shot. Advised the patient to get pneumonia shot.

## 2016-02-29 NOTE — Assessment & Plan Note (Signed)
Patient with acute hypoxemic respiratory failure when he was admitted in October 2017. He was discharged on oxygen, 2 L at bedtime. He is checking his O2 saturation. He keeps it more than 88%. Need to do ONO or sleep study once he has insurance.

## 2016-02-29 NOTE — Progress Notes (Signed)
Subjective:    Patient ID: Zachary Brown, male    DOB: April 27, 1950, 65 y.o.   MRN: [id number]  HPI 65 yo Male, admitted in October for hypoxemic respiratory failure. 5-pack-year smoking history, quit in his 7420s. Denies any lung issues prior to the admission. He had hypoxemic respiratory failure 2/2 ILD/PNA. Workup at the end of today was unrevealing as far as  infection is concerned. He ended up getting BiPAP, prednisone, diuretics, oxygen. 2dEcho showed possible RV pressure and volume overload.  He was discharged on 01/30/16, significantly improved.   He has improved. Better at baseline. Denies SOB. Gets SOB with more than ADLs. He is weaning off O2. He is supposed to be on o2 at HS. He hardly uses alb.   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  Progressive DOE since 06/2015 with associated leg swelling. However, as mentioned, he is better than baseline.  He can do ADLs w/o much issues. Gets SOB with more than ADLs.   TEST  Studies 10/2 CT chest>>>bilateral infiltrates, no PE 10/5 VQ>>>int prob  10/7 CT chest>>>improving 10/8 [facility]  Cultures: 01/22/2016>> Viral Panel Negative 01/22/2016>> MRSA Negative      Past Medical History:  Diagnosis Date  . Pneumonia 1956   severe  . Snake bite 1980   copper heaed   Current Outpatient Prescriptions on File Prior to Visit  Medication Sig Dispense Refill  . albuterol (PROVENTIL HFA;VENTOLIN HFA) 108 (90 Base) MCG/ACT inhaler Inhale 1 puff into the lungs every 6 (six) hours as needed for wheezing or shortness of breath.    Marland Kitchen. aspirin EC 81 MG tablet Take 81 mg by mouth 4 (four) times a week.     . furosemide (LASIX) 20 MG tablet Take 1 tablet by mouth daily.  2  . metFORMIN (GLUCOPHAGE) 500 MG tablet Take 1 tablet (500 mg total) by mouth 2 (two) times daily with a meal. 60 tablet 2    No current facility-administered medications on file prior to visit.     Review of Systems  Constitutional: Negative.   HENT: Negative.   Eyes: Negative.   Respiratory: Positive for cough and shortness of breath.   Cardiovascular: Negative.   Gastrointestinal: Negative.   Endocrine: Negative.   Genitourinary: Negative.   Musculoskeletal: Negative.   Skin: Negative.   Allergic/Immunologic: Negative.   Neurological: Negative.   Hematological: Negative.   Psychiatric/Behavioral: Negative.   All other systems reviewed and are negative.       Objective:   Physical Exam Vitals:   02/29/16 1455  BP: 132/84  Pulse: (!) 103  SpO2: 95%  Weight: 164 lb 12.8 oz (74.8 kg)  Height: 5\' 9"  (1.753 m)   Vitals:  Vitals:   02/29/16 1455  BP: 132/84  Pulse: (!) 103  SpO2: 95%  Weight: 164 lb 12.8 oz (74.8 kg)  Height: 5\' 9"  (1.753 m)    Constitutional/General:  Pleasant, well-nourished, well-developed, not in any distress,  Comfortably seating.  Well kempt  Body mass index is 24.34 kg/m. Wt Readings from Last 3 Encounters:  02/29/16 164 lb 12.8 oz (74.8 kg)  02/05/16 180 lb (81.6 kg)  01/30/16 180 lb 1.9 oz (81.7 kg)    HEENT: Pupils equal and reactive to light and accommodation. Anicteric sclerae. Normal nasal mucosa.   No oral  lesions,  mouth clear,  oropharynx clear,  no postnasal drip. (-) Oral thrush. No dental caries.  Airway - Mallampati class III  Neck: No masses. Midline trachea. No JVD, (-) LAD. (-) bruits appreciated.  Respiratory/Chest: Grossly normal chest. (-) deformity. (-) Accessory muscle use.  Symmetric expansion. (-) Tenderness on palpation.  Resonant on percussion.  Diminished BS on both lower lung zones. (-) wheezing, rhonchi Bibasilar crackles.  (-) egophony  Cardiovascular: Regular rate and  rhythm, heart sounds normal, no murmur or gallops, no peripheral edema  Gastrointestinal:  Normal bowel sounds. Soft, non-tender. No  hepatosplenomegaly.  (-) masses.   Musculoskeletal:  Normal muscle tone. Normal gait.   Extremities: Grossly normal. (-) clubbing, cyanosis.  (-) edema  Skin: (-) rash,lesions seen.   Neurological/Psychiatric : alert, oriented to time, place, person. Normal mood and affect         Assessment & Plan:  ILD (interstitial lung disease) (HCC) Nonsmoker. Patient admitted in October 2017 for acute on chronic exertional dyspnea. He had hypoxemic respiratory failure secondary to diffuse lung infiltrates/ILD/pneumonia. He improved with Levaquin, diuretics, prednisone, BiPAP. He was discharged significantly improved. He states he is better than baseline.  Chest CT scan October 2017 with bilateral infiltrates, no effusion. He had a chest x-ray and of October which showed significant improvement in the infiltrates.   PFT 02/2016 >FEV175%, ratio 87, FVC 64%, no sign BD response, TLC 68%, DLCO 22%.   As mentioned, he states he is better than baseline. He is able to check his O2 saturation is usually more than 88% on room air with exertion. Uses albuterol sparingly.  Unfortunately, he does not have any insurance. He is working on getting Medicaid or disability.  Plan : 1. I advised the patient to give [place] a call once he has Medicaid or any type of insurance. At that point, he will need a repeat chest CT scan (HRCT) as well as ABG. ABG because of the low diffusion capacity based on PFT. He will also need a repeat CBC, BMET just to make sure there are no underlying issues causing low diffusion capacity. He will also need a repeat 2-D echo. Echo done in October showed decreased RV function, RV pressure and volume overload. He will also need a sleep study to check for sleep apnea, need for oxygen at bedtime. I extensively discussed the plan with the patient and he is in agreement. 2. Patient received a flu shot. Advised the patient to get pneumonia shot.   Hypoxia Patient with acute hypoxemic  respiratory failure when he was admitted in October 2017. He was discharged on oxygen, 2 L at bedtime. He is checking his O2 saturation. He keeps it more than 88%. Need to do ONO or sleep study once he has insurance.      Return to clinic in 3-4 months  J. Alexis Frock, MD 02/29/2016, 3:45 PM  Pulmonary and Critical Care Pager [phone] After 3 pm or if no answer, call [phone]

## 2016-02-29 NOTE — Patient Instructions (Signed)
It was a pleasure taking care of you today!  You are diagnosed with Interstitial Lung Disease (ILD).  This usually causes swelling and/or scarring of your lungs causing you to be short of breath, have cough, or have low oxygen level.   Please let us know once we have your Medicaid. You will need repeat chest CT scan, ultrasound of your heart, blood tests, and a sleep study once we have your Medicaid.  Keep oxygen level more than 88%. Use oxygen as needed.  Return to clinic in 3-4 months.

## 2016-03-01 ENCOUNTER — Ambulatory Visit: Payer: Self-pay | Admitting: Pulmonary Disease

## 2016-07-03 ENCOUNTER — Ambulatory Visit: Payer: Self-pay | Admitting: Pulmonary Disease

## 2018-04-01 IMAGING — DX DG CHEST 2V
2 series · 2 of 2 positions shown · non-contrast
Comparison: 01/27/2016.

CLINICAL DATA: Interstitial lung disease.

EXAM:
CHEST  2 VIEW

[chest pa]
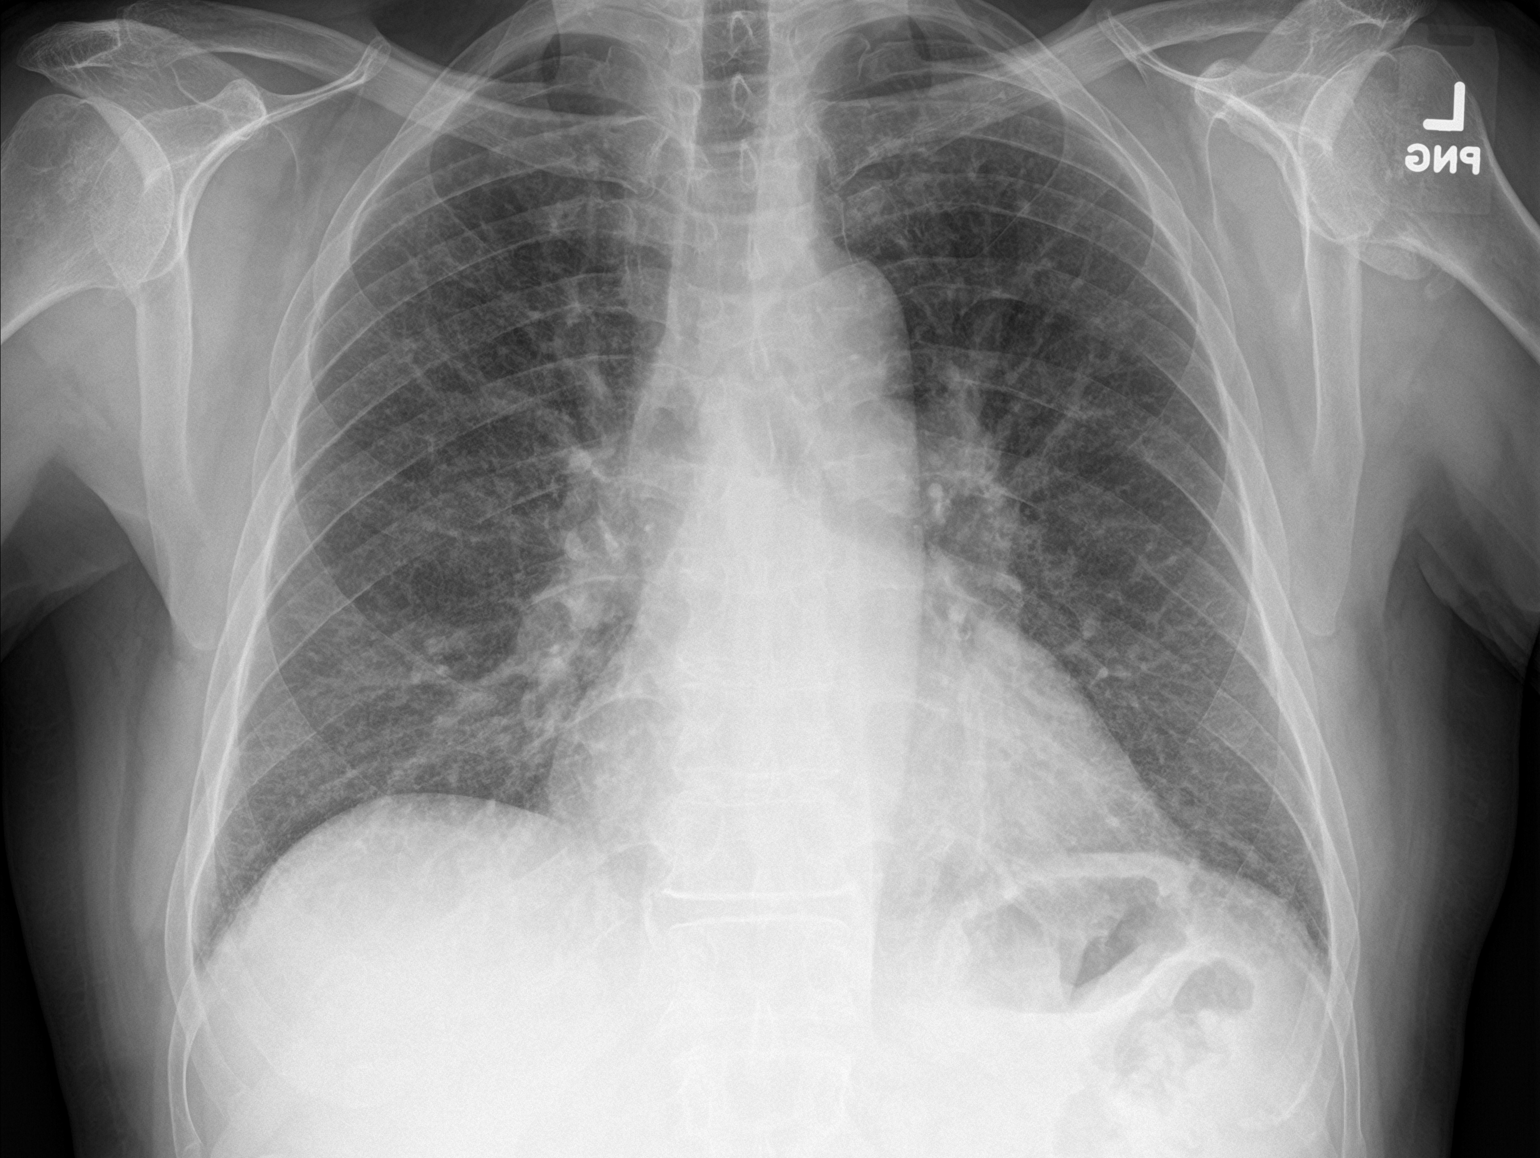

[chest lat]
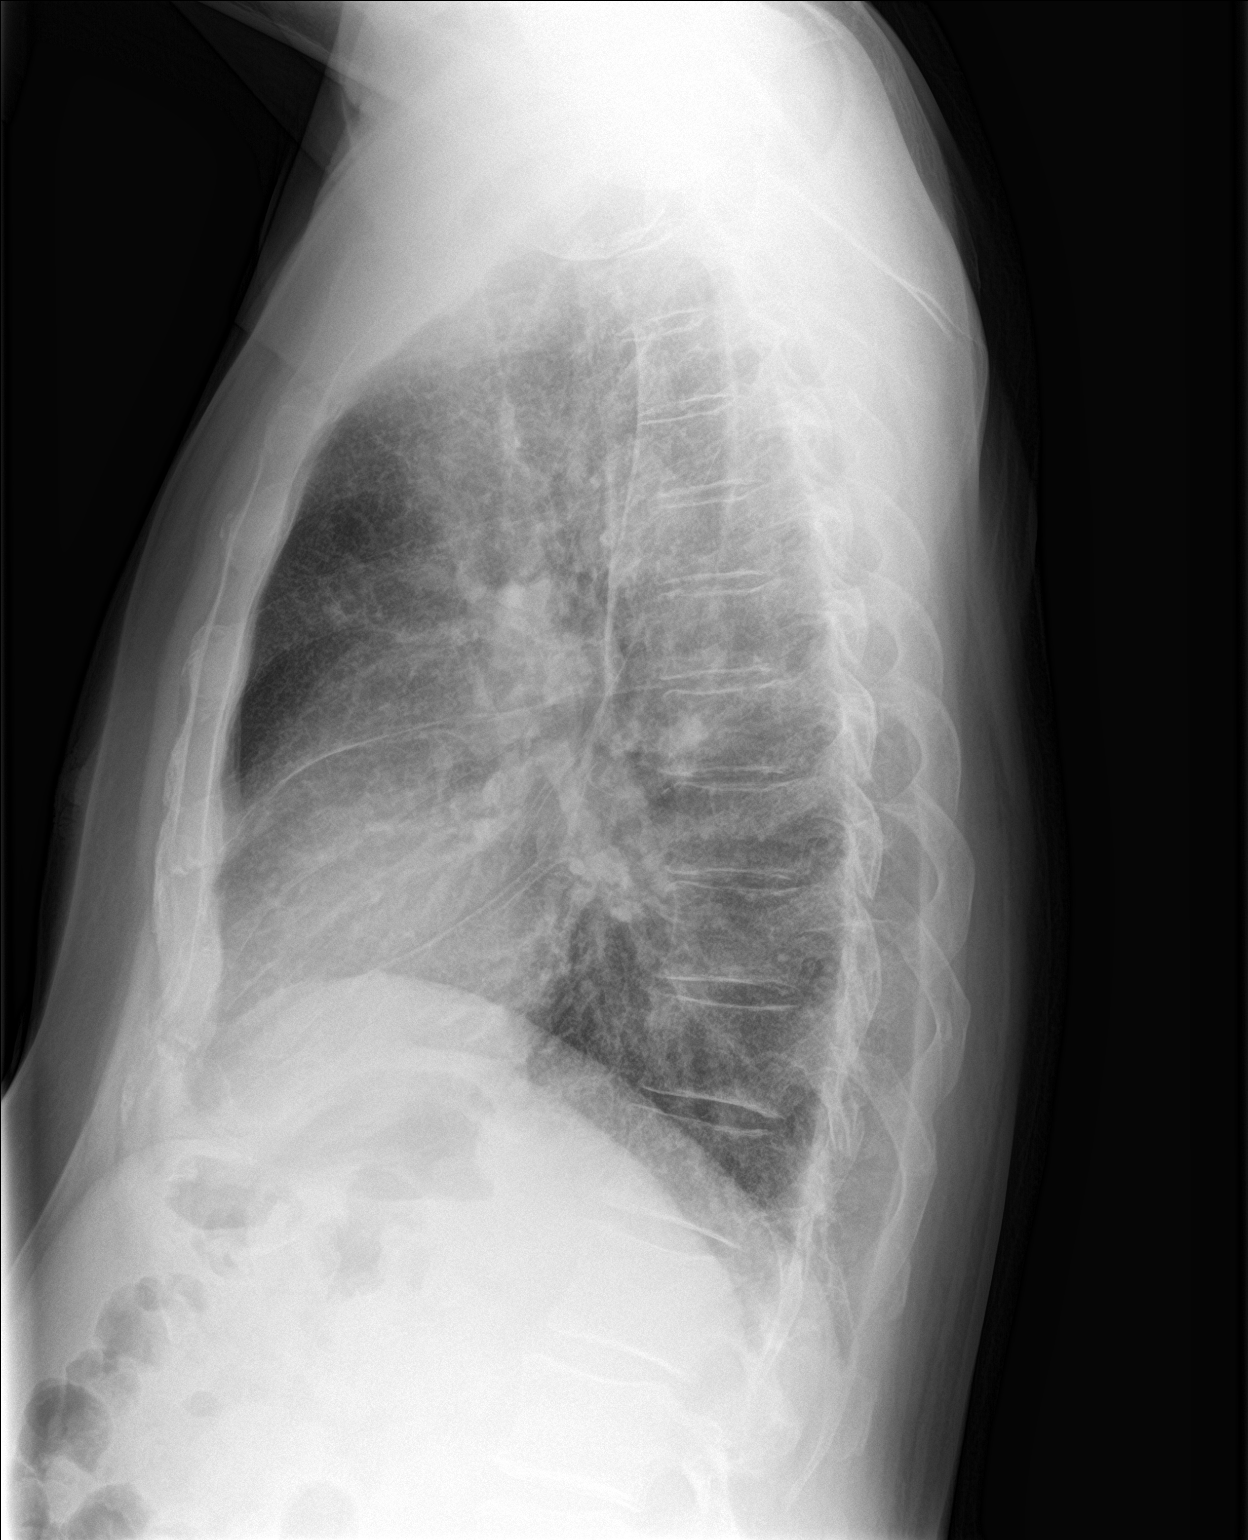

[2 of 2 positions shown; findings below may reference images not displayed]

FINDINGS: Stable mild cardiomegaly. Interim improvement of bilateral
interstitial prominence suggesting resolving pulmonary interstitial
edema/pneumonitis. A component of underlying chronic interstitial
lung disease cannot be excluded. No pleural effusion or
pneumothorax.
IMPRESSION: Interim improvement bilateral from interstitial prominence noted
suggesting improving interstitial edema/pneumonitis. A component of
chronic interstitial lung disease cannot be excluded.

## 2018-11-11 ENCOUNTER — Other Ambulatory Visit: Payer: Self-pay

## 2023-11-21 ENCOUNTER — Encounter (HOSPITAL_COMMUNITY): Payer: Self-pay | Admitting: Critical Care Medicine

## 2023-11-21 ENCOUNTER — Inpatient Hospital Stay (HOSPITAL_COMMUNITY)

## 2023-11-21 ENCOUNTER — Inpatient Hospital Stay (HOSPITAL_COMMUNITY)
Admission: EM | Admit: 2023-11-21 | Discharge: 2023-12-15 | DRG: 023 | Disposition: E | Source: Other Acute Inpatient Hospital | Attending: Neurology | Admitting: Neurology

## 2023-11-21 ENCOUNTER — Encounter (HOSPITAL_COMMUNITY): Payer: Self-pay

## 2023-11-21 DIAGNOSIS — R918 Other nonspecific abnormal finding of lung field: Secondary | ICD-10-CM | POA: Diagnosis not present

## 2023-11-21 DIAGNOSIS — Z79899 Other long term (current) drug therapy: Secondary | ICD-10-CM | POA: Diagnosis not present

## 2023-11-21 DIAGNOSIS — Z9981 Dependence on supplemental oxygen: Secondary | ICD-10-CM

## 2023-11-21 DIAGNOSIS — Z743 Need for continuous supervision: Secondary | ICD-10-CM | POA: Diagnosis not present

## 2023-11-21 DIAGNOSIS — J90 Pleural effusion, not elsewhere classified: Secondary | ICD-10-CM | POA: Diagnosis not present

## 2023-11-21 DIAGNOSIS — D72829 Elevated white blood cell count, unspecified: Secondary | ICD-10-CM | POA: Diagnosis not present

## 2023-11-21 DIAGNOSIS — I110 Hypertensive heart disease with heart failure: Secondary | ICD-10-CM | POA: Diagnosis present

## 2023-11-21 DIAGNOSIS — J690 Pneumonitis due to inhalation of food and vomit: Secondary | ICD-10-CM | POA: Diagnosis not present

## 2023-11-21 DIAGNOSIS — G8194 Hemiplegia, unspecified affecting left nondominant side: Secondary | ICD-10-CM | POA: Diagnosis present

## 2023-11-21 DIAGNOSIS — N179 Acute kidney failure, unspecified: Secondary | ICD-10-CM | POA: Diagnosis present

## 2023-11-21 DIAGNOSIS — Z87891 Personal history of nicotine dependence: Secondary | ICD-10-CM

## 2023-11-21 DIAGNOSIS — R29703 NIHSS score 3: Secondary | ICD-10-CM | POA: Diagnosis not present

## 2023-11-21 DIAGNOSIS — G911 Obstructive hydrocephalus: Secondary | ICD-10-CM | POA: Diagnosis not present

## 2023-11-21 DIAGNOSIS — Y92008 Other place in unspecified non-institutional (private) residence as the place of occurrence of the external cause: Secondary | ICD-10-CM

## 2023-11-21 DIAGNOSIS — J9600 Acute respiratory failure, unspecified whether with hypoxia or hypercapnia: Secondary | ICD-10-CM | POA: Diagnosis not present

## 2023-11-21 DIAGNOSIS — I480 Paroxysmal atrial fibrillation: Secondary | ICD-10-CM | POA: Diagnosis not present

## 2023-11-21 DIAGNOSIS — R220 Localized swelling, mass and lump, head: Secondary | ICD-10-CM | POA: Diagnosis not present

## 2023-11-21 DIAGNOSIS — I619 Nontraumatic intracerebral hemorrhage, unspecified: Secondary | ICD-10-CM | POA: Diagnosis not present

## 2023-11-21 DIAGNOSIS — Z7189 Other specified counseling: Secondary | ICD-10-CM | POA: Diagnosis not present

## 2023-11-21 DIAGNOSIS — R17 Unspecified jaundice: Secondary | ICD-10-CM | POA: Diagnosis present

## 2023-11-21 DIAGNOSIS — S0633AA Contusion and laceration of cerebrum, unspecified, with loss of consciousness status unknown, initial encounter: Secondary | ICD-10-CM | POA: Diagnosis present

## 2023-11-21 DIAGNOSIS — J984 Other disorders of lung: Secondary | ICD-10-CM | POA: Diagnosis not present

## 2023-11-21 DIAGNOSIS — R29705 NIHSS score 5: Secondary | ICD-10-CM | POA: Diagnosis not present

## 2023-11-21 DIAGNOSIS — E785 Hyperlipidemia, unspecified: Secondary | ICD-10-CM | POA: Diagnosis present

## 2023-11-21 DIAGNOSIS — W1830XA Fall on same level, unspecified, initial encounter: Secondary | ICD-10-CM | POA: Diagnosis present

## 2023-11-21 DIAGNOSIS — I639 Cerebral infarction, unspecified: Secondary | ICD-10-CM | POA: Diagnosis present

## 2023-11-21 DIAGNOSIS — E119 Type 2 diabetes mellitus without complications: Secondary | ICD-10-CM

## 2023-11-21 DIAGNOSIS — Z4682 Encounter for fitting and adjustment of non-vascular catheter: Secondary | ICD-10-CM | POA: Diagnosis not present

## 2023-11-21 DIAGNOSIS — E870 Hyperosmolality and hypernatremia: Secondary | ICD-10-CM | POA: Diagnosis present

## 2023-11-21 DIAGNOSIS — I610 Nontraumatic intracerebral hemorrhage in hemisphere, subcortical: Secondary | ICD-10-CM | POA: Diagnosis present

## 2023-11-21 DIAGNOSIS — I2781 Cor pulmonale (chronic): Secondary | ICD-10-CM | POA: Diagnosis present

## 2023-11-21 DIAGNOSIS — G9389 Other specified disorders of brain: Secondary | ICD-10-CM | POA: Diagnosis present

## 2023-11-21 DIAGNOSIS — J439 Emphysema, unspecified: Secondary | ICD-10-CM | POA: Diagnosis present

## 2023-11-21 DIAGNOSIS — E440 Moderate protein-calorie malnutrition: Secondary | ICD-10-CM | POA: Diagnosis present

## 2023-11-21 DIAGNOSIS — R9431 Abnormal electrocardiogram [ECG] [EKG]: Secondary | ICD-10-CM | POA: Diagnosis not present

## 2023-11-21 DIAGNOSIS — J849 Interstitial pulmonary disease, unspecified: Secondary | ICD-10-CM | POA: Diagnosis present

## 2023-11-21 DIAGNOSIS — S06311A Contusion and laceration of right cerebrum with loss of consciousness of 30 minutes or less, initial encounter: Secondary | ICD-10-CM | POA: Diagnosis not present

## 2023-11-21 DIAGNOSIS — I5032 Chronic diastolic (congestive) heart failure: Secondary | ICD-10-CM | POA: Diagnosis present

## 2023-11-21 DIAGNOSIS — E1165 Type 2 diabetes mellitus with hyperglycemia: Secondary | ICD-10-CM | POA: Diagnosis not present

## 2023-11-21 DIAGNOSIS — Z77090 Contact with and (suspected) exposure to asbestos: Secondary | ICD-10-CM | POA: Diagnosis present

## 2023-11-21 DIAGNOSIS — I672 Cerebral atherosclerosis: Secondary | ICD-10-CM | POA: Diagnosis not present

## 2023-11-21 DIAGNOSIS — Z1389 Encounter for screening for other disorder: Secondary | ICD-10-CM | POA: Diagnosis not present

## 2023-11-21 DIAGNOSIS — J9601 Acute respiratory failure with hypoxia: Secondary | ICD-10-CM | POA: Diagnosis not present

## 2023-11-21 DIAGNOSIS — J9621 Acute and chronic respiratory failure with hypoxia: Secondary | ICD-10-CM | POA: Diagnosis present

## 2023-11-21 DIAGNOSIS — E860 Dehydration: Secondary | ICD-10-CM | POA: Diagnosis present

## 2023-11-21 DIAGNOSIS — I615 Nontraumatic intracerebral hemorrhage, intraventricular: Secondary | ICD-10-CM | POA: Diagnosis not present

## 2023-11-21 DIAGNOSIS — Z781 Physical restraint status: Secondary | ICD-10-CM

## 2023-11-21 DIAGNOSIS — Z8249 Family history of ischemic heart disease and other diseases of the circulatory system: Secondary | ICD-10-CM

## 2023-11-21 DIAGNOSIS — S06310A Contusion and laceration of right cerebrum without loss of consciousness, initial encounter: Principal | ICD-10-CM

## 2023-11-21 DIAGNOSIS — I517 Cardiomegaly: Secondary | ICD-10-CM | POA: Diagnosis not present

## 2023-11-21 DIAGNOSIS — D751 Secondary polycythemia: Secondary | ICD-10-CM | POA: Diagnosis present

## 2023-11-21 DIAGNOSIS — I4519 Other right bundle-branch block: Secondary | ICD-10-CM | POA: Diagnosis not present

## 2023-11-21 DIAGNOSIS — D696 Thrombocytopenia, unspecified: Secondary | ICD-10-CM | POA: Diagnosis not present

## 2023-11-21 DIAGNOSIS — D7589 Other specified diseases of blood and blood-forming organs: Secondary | ICD-10-CM | POA: Diagnosis not present

## 2023-11-21 DIAGNOSIS — I611 Nontraumatic intracerebral hemorrhage in hemisphere, cortical: Secondary | ICD-10-CM | POA: Diagnosis present

## 2023-11-21 DIAGNOSIS — S0631AA Contusion and laceration of right cerebrum with loss of consciousness status unknown, initial encounter: Secondary | ICD-10-CM | POA: Diagnosis not present

## 2023-11-21 DIAGNOSIS — I609 Nontraumatic subarachnoid hemorrhage, unspecified: Secondary | ICD-10-CM | POA: Diagnosis not present

## 2023-11-21 DIAGNOSIS — I6782 Cerebral ischemia: Secondary | ICD-10-CM | POA: Diagnosis not present

## 2023-11-21 DIAGNOSIS — Z7982 Long term (current) use of aspirin: Secondary | ICD-10-CM

## 2023-11-21 DIAGNOSIS — G936 Cerebral edema: Secondary | ICD-10-CM | POA: Diagnosis present

## 2023-11-21 DIAGNOSIS — Z515 Encounter for palliative care: Secondary | ICD-10-CM

## 2023-11-21 DIAGNOSIS — R4701 Aphasia: Secondary | ICD-10-CM | POA: Diagnosis not present

## 2023-11-21 DIAGNOSIS — I618 Other nontraumatic intracerebral hemorrhage: Secondary | ICD-10-CM | POA: Diagnosis not present

## 2023-11-21 DIAGNOSIS — Z6829 Body mass index (BMI) 29.0-29.9, adult: Secondary | ICD-10-CM

## 2023-11-21 DIAGNOSIS — I5030 Unspecified diastolic (congestive) heart failure: Secondary | ICD-10-CM | POA: Diagnosis not present

## 2023-11-21 DIAGNOSIS — I629 Nontraumatic intracranial hemorrhage, unspecified: Secondary | ICD-10-CM | POA: Diagnosis not present

## 2023-11-21 DIAGNOSIS — J432 Centrilobular emphysema: Secondary | ICD-10-CM | POA: Diagnosis not present

## 2023-11-21 DIAGNOSIS — G910 Communicating hydrocephalus: Secondary | ICD-10-CM | POA: Diagnosis not present

## 2023-11-21 DIAGNOSIS — G914 Hydrocephalus in diseases classified elsewhere: Secondary | ICD-10-CM | POA: Diagnosis not present

## 2023-11-21 DIAGNOSIS — I6523 Occlusion and stenosis of bilateral carotid arteries: Secondary | ICD-10-CM | POA: Diagnosis not present

## 2023-11-21 DIAGNOSIS — Z66 Do not resuscitate: Secondary | ICD-10-CM | POA: Diagnosis present

## 2023-11-21 DIAGNOSIS — R140 Abdominal distension (gaseous): Secondary | ICD-10-CM | POA: Diagnosis not present

## 2023-11-21 DIAGNOSIS — K6389 Other specified diseases of intestine: Secondary | ICD-10-CM | POA: Diagnosis not present

## 2023-11-21 DIAGNOSIS — Z7984 Long term (current) use of oral hypoglycemic drugs: Secondary | ICD-10-CM

## 2023-11-21 DIAGNOSIS — R55 Syncope and collapse: Secondary | ICD-10-CM | POA: Diagnosis not present

## 2023-11-21 DIAGNOSIS — R7401 Elevation of levels of liver transaminase levels: Secondary | ICD-10-CM | POA: Diagnosis not present

## 2023-11-21 DIAGNOSIS — I69391 Dysphagia following cerebral infarction: Secondary | ICD-10-CM | POA: Diagnosis not present

## 2023-11-21 DIAGNOSIS — R1310 Dysphagia, unspecified: Secondary | ICD-10-CM | POA: Diagnosis present

## 2023-11-21 DIAGNOSIS — R531 Weakness: Secondary | ICD-10-CM | POA: Diagnosis not present

## 2023-11-21 DIAGNOSIS — Z825 Family history of asthma and other chronic lower respiratory diseases: Secondary | ICD-10-CM

## 2023-11-21 DIAGNOSIS — R0902 Hypoxemia: Secondary | ICD-10-CM | POA: Diagnosis not present

## 2023-11-21 DIAGNOSIS — J811 Chronic pulmonary edema: Secondary | ICD-10-CM | POA: Diagnosis not present

## 2023-11-21 HISTORY — DX: Chronic respiratory failure with hypoxia: J96.11

## 2023-11-21 HISTORY — DX: Heart failure, unspecified: I50.9

## 2023-11-21 LAB — MRSA NEXT GEN BY PCR, NASAL: MRSA by PCR Next Gen: NOT DETECTED

## 2023-11-21 LAB — GLUCOSE, CAPILLARY: Glucose-Capillary: 160 mg/dL — ABNORMAL HIGH (ref 70–99)

## 2023-11-21 MED ORDER — CHLORHEXIDINE GLUCONATE CLOTH 2 % EX PADS
6.0000 | MEDICATED_PAD | Freq: Every day | CUTANEOUS | Status: DC
  Administered 2023-11-22 – 2023-11-29 (×11): 6 via TOPICAL

## 2023-11-21 MED ORDER — DOCUSATE SODIUM 100 MG PO CAPS
100.0000 mg | ORAL_CAPSULE | Freq: Two times a day (BID) | ORAL | Status: DC | PRN
  Filled 2023-11-21: qty 1

## 2023-11-21 MED ORDER — POLYETHYLENE GLYCOL 3350 17 G PO PACK
17.0000 g | PACK | Freq: Every day | ORAL | Status: DC | PRN
  Administered 2023-11-27: 17 g via ORAL
  Filled 2023-11-21: qty 1

## 2023-11-21 MED ORDER — INSULIN ASPART 100 UNIT/ML IJ SOLN
0.0000 [IU] | INTRAMUSCULAR | Status: DC
  Administered 2023-11-22 – 2023-11-24 (×15): 1 [IU] via SUBCUTANEOUS

## 2023-11-21 MED ORDER — ONDANSETRON HCL 4 MG/2ML IJ SOLN
4.0000 mg | Freq: Four times a day (QID) | INTRAMUSCULAR | Status: DC | PRN
Start: 2023-11-21 — End: 2023-11-29
  Administered 2023-11-22 – 2023-11-23 (×2): 4 mg via INTRAVENOUS
  Filled 2023-11-21 (×2): qty 2

## 2023-11-21 MED ORDER — ORAL CARE MOUTH RINSE: 15.0000 mL | OROMUCOSAL | Status: DC | PRN

## 2023-11-21 MED ORDER — ACETAMINOPHEN 325 MG PO TABS
650.0000 mg | ORAL_TABLET | ORAL | Status: DC | PRN
  Administered 2023-11-22 – 2023-11-23 (×2): 650 mg via ORAL
  Filled 2023-11-21 (×3): qty 2

## 2023-11-21 MED ORDER — LABETALOL HCL 5 MG/ML IV SOLN
10.0000 mg | INTRAVENOUS | Status: DC | PRN
  Administered 2023-11-22 (×5): 10 mg via INTRAVENOUS
  Filled 2023-11-21 (×4): qty 4

## 2023-11-21 MED ORDER — FAMOTIDINE 20 MG PO TABS
20.0000 mg | ORAL_TABLET | Freq: Two times a day (BID) | ORAL | Status: DC
  Administered 2023-11-21 – 2023-11-24 (×7): 20 mg via ORAL
  Filled 2023-11-21 (×7): qty 1

## 2023-11-21 NOTE — H&P (Signed)
 NAME:  Zachary Brown, MRN:  [id number], DOB:  April 24, 1950, LOS: 0 ADMISSION DATE:  11/21/2023, CONSULTATION DATE:  8/8 REFERRING MD:  Dr Charlesetta [facility] ED), CHIEF COMPLAINT:  ICH   History of Present Illness:  73 year old male with PMH as below who presented to [facility] ED 8/8 after being found on the floor by his daughter. He reports having possibly falling although he does not remember the events surrounding the incident, but feels as though he did not hit his head. He estimates being on the floor for approximately three hours prior to being found by his daughter. ED documentation reports the patient walking and left leg growing weak as well as Left arm numbness prior to fall. . Other areas on injury include the left forearm. Upon arrival to the ED he was noted to have left sided weakness and was immediatly taken for CT of the head. Found to have right sided basal ganglia IPH with intraventricular extension. He was started on nicardipine infusion for blood pressure control . Neurosurgery at [facility] was consulted and recommended transfer for neuro ICU admission. PCCM accepted the patient for transfer.   Pertinent  Medical History   has a past medical history of Pneumonia (1956) and Snake bite (1980). CHF (Grade 1 DD on echo from 2017)   Significant Hospital Events: Including procedures, antibiotic start and stop dates in addition to other pertinent events   8/8 admit for IPH  Interim History / Subjective:    Objective    There were no vitals taken for this visit.       No intake or output data in the 24 hours ending 11/21/23 2118 There were no vitals filed for this visit.  Examination: General: Elderly male of normal body habitus in NAD HENT: [place]/AT, PERRL no JVD Lungs: Clear bilateral breath sounds Cardiovascular: RRR, no MRG Abdomen: Soft, NT, ND Extremities: No acute deformity Neuro: Alert, oriented, non-focal. PERRL, Tongue midline. Cranial nerves intact. 5/5 strength in  the right upper and lower extremities. 4/5 strength in the L upper and lower.   [place] Lab Results: WBC 11, Hgb 20, Hct 60, Plt 147, Na 137, K 4.4, Cr 0.8, BUN 16, T bili 2.4, AST 43, ALT 35, INR 1.1  CT head: Large thalamic acute hemorrhage with acute blood extending into the right lateral ventricle and to a lesser extent the third ventricle  PFT 02/2016 >FEV175%, ratio 87, FVC 64%, no sign BD response, TLC 68%, DLCO 22%.   Resolved problem list   Assessment and Plan    Intraparenchymal hemorrhage: right basal ganglia with extension into the right lateral and third ventricles. Concern for hypertensive bleed based on location, but did fall as well.  - Dr. Gretta spoke with Neurosurgery ( Dr. Debby) who have recommended CT head in the AM and will plan to consult - Keep SBP < Hg - PRN labetalol  - BP well controlled currently - Neuro protective measures per RN - NPO - Repeat CT in AM  Possible ILD: started workup in pulm office 2017 but lost to follow up.  Acute on chronic respiratory failure with hypoxia - Home O2 concentrator has been broken - on 2L at home but 5L here - CXR now - Keep sats > 90%  Hx HFpEF ? Syncope - Careful with IVF - Echo - EKG - holding home ASA  Elevated bilirubin - RUQ ultrasound  DM - hold metformin  (? If taking med list is largely inaccurate) - CBG monitoring and  SSI  Best Practice (right click and Reselect all SmartList Selections daily)   Diet/type: NPO DVT prophylaxis SCD Pressure ulcer(s): pressure ulcer assessment deferred  GI prophylaxis: N/A Lines: N/A Foley:  N/A Code Status:  full code Last date of multidisciplinary goals of care discussion [ ]   Labs   CBC: No results for input(s): WBC, NEUTROABS, HGB, HCT, MCV, PLT in the last 168 hours.  Basic Metabolic Panel: No results for input(s): NA, K, CL, CO2, GLUCOSE, BUN, CREATININE, CALCIUM, MG, PHOS in the last 168 hours. GFR: CrCl  cannot be calculated (Patient's most recent lab result is older than the maximum 21 days allowed.). No results for input(s): PROCALCITON, WBC, LATICACIDVEN in the last 168 hours.  Liver Function Tests: No results for input(s): AST, ALT, ALKPHOS, BILITOT, PROT, ALBUMIN in the last 168 hours. No results for input(s): LIPASE, AMYLASE in the last 168 hours. No results for input(s): AMMONIA in the last 168 hours.  ABG    Component Value Date/Time   PHART 7.406 01/21/2016 2141   PCO2ART 90.4 (HH) 01/21/2016 2141   PO2ART 55.9 (L) 01/21/2016 2141   HCO3 55.6 (H) 01/21/2016 2141   O2SAT 88.0 01/21/2016 2141     Coagulation Profile: No results for input(s): INR, PROTIME in the last 168 hours.  Cardiac Enzymes: No results for input(s): CKTOTAL, CKMB, CKMBINDEX, TROPONINI in the last 168 hours.  HbA1C: Hgb A1c MFr Bld  Date/Time Value Ref Range Status  01/29/2016 02:43 PM 8.0 (H) 4.8 - 5.6 % Final    Comment:    (NOTE)         Pre-diabetes: 5.7 - 6.4         Diabetes: >6.4         Glycemic control for adults with diabetes: <7.0     CBG: No results for input(s): GLUCAP in the last 168 hours.  Review of Systems:    Bolds are positive  Constitutional: weight loss, gain, night sweats, Fevers, chills, fatigue.  HEENT: headaches, Sore throat, sneezing, nasal congestion, post nasal drip, Difficulty swallowing, Tooth/dental problems, visual complaints visual changes, ear ache CV:  chest pain, radiates:,Orthopnea, PND, swelling in lower extremities, dizziness, palpitations, syncope.  GI  heartburn, indigestion, abdominal pain, nausea, vomiting, diarrhea, change in bowel habits, loss of appetite, bloody stools.  Resp: cough, productive: , hemoptysis, dyspnea, chest pain, pleuritic.  Skin: rash or itching or icterus GU: dysuria, change in color of urine, urgency or frequency. flank pain, hematuria  MS: joint pain or swelling. decreased range of motion   Psych: change in mood or affect. depression or anxiety.  Neuro: difficulty with speech, weakness, numbness, ataxia    Past Medical History:  He,  has a past medical history of Pneumonia (1956) and Snake bite (1980).   Surgical History:  No past surgical history on file.   Social History:   reports that he quit smoking about 42 years ago. His smoking use included cigarettes. He started smoking about 62 years ago. He has a 20 pack-year smoking history. He has never used smokeless tobacco. He reports that he does not drink alcohol and does not use drugs.   Family History:  His family history is not on file.   Allergies No Known Allergies   Home Medications  Prior to Admission medications   Medication Sig Start Date End Date Taking? Authorizing Provider  albuterol  (PROVENTIL  HFA;VENTOLIN  HFA) 108 (90 Base) MCG/ACT inhaler Inhale 1 puff into the lungs every 6 (six) hours as needed for wheezing or  shortness of breath.    [provider]  aspirin  EC 81 MG tablet Take 81 mg by mouth 4 (four) times a week.     [provider]  furosemide  (LASIX ) 20 MG tablet Take 1 tablet by mouth daily. 02/02/16   [provider]  metFORMIN  (GLUCOPHAGE ) 500 MG tablet Take 1 tablet (500 mg total) by mouth 2 (two) times daily with a meal. 01/30/16   Ruthell Lauraine FALCON, NP     Critical care time: 38 minutes      Deward Eastern, AGACNP-BC [facility]  See Amion for personal pager PCCM on call pager [phone] until 7pm. Please call Elink 7p-7a. [phone]  11/21/2023 9:55 PM

## 2023-11-21 NOTE — Progress Notes (Signed)
 eLink Physician-Brief Progress Note Patient Name: Zachary Brown DOB: [birth year] MRN: [id number]   Date of Service  11/21/2023  HPI/Events of Note  73 y/o gentleman with a history of asbestosis, chronic respiratory failure on 2L HOT (machine broke, so not currently using oxygen), DM, who presented with a fall in his house and LOC secondary to a right basal ganglia intraparenchymal hemorrhage  Vital signs within normal limits, saturating 91% on supplemental oxygen.  Results are pending.  CT with evidence of hemorrhage.  eICU Interventions  Strict blood pressure control, frequent neurochecks, neuroprotective measures, interval Cts, standard stroke workup  Strict I's and O's  DVT prophylaxis with SCDs GI prophylaxis with famotidine      Intervention Category Evaluation Type: New Patient Evaluation  Arrion Burruel 11/21/2023, 9:52 PM

## 2023-11-22 ENCOUNTER — Inpatient Hospital Stay (HOSPITAL_COMMUNITY)

## 2023-11-22 DIAGNOSIS — I6523 Occlusion and stenosis of bilateral carotid arteries: Secondary | ICD-10-CM | POA: Diagnosis not present

## 2023-11-22 DIAGNOSIS — R55 Syncope and collapse: Secondary | ICD-10-CM | POA: Diagnosis not present

## 2023-11-22 DIAGNOSIS — I5030 Unspecified diastolic (congestive) heart failure: Secondary | ICD-10-CM

## 2023-11-22 DIAGNOSIS — J9621 Acute and chronic respiratory failure with hypoxia: Secondary | ICD-10-CM | POA: Diagnosis not present

## 2023-11-22 DIAGNOSIS — R29703 NIHSS score 3: Secondary | ICD-10-CM | POA: Diagnosis not present

## 2023-11-22 DIAGNOSIS — I672 Cerebral atherosclerosis: Secondary | ICD-10-CM | POA: Diagnosis not present

## 2023-11-22 DIAGNOSIS — I69391 Dysphagia following cerebral infarction: Secondary | ICD-10-CM

## 2023-11-22 DIAGNOSIS — I615 Nontraumatic intracerebral hemorrhage, intraventricular: Secondary | ICD-10-CM | POA: Diagnosis not present

## 2023-11-22 DIAGNOSIS — I610 Nontraumatic intracerebral hemorrhage in hemisphere, subcortical: Secondary | ICD-10-CM

## 2023-11-22 DIAGNOSIS — E119 Type 2 diabetes mellitus without complications: Secondary | ICD-10-CM | POA: Diagnosis not present

## 2023-11-22 DIAGNOSIS — I609 Nontraumatic subarachnoid hemorrhage, unspecified: Secondary | ICD-10-CM

## 2023-11-22 DIAGNOSIS — S06310A Contusion and laceration of right cerebrum without loss of consciousness, initial encounter: Secondary | ICD-10-CM | POA: Diagnosis not present

## 2023-11-22 DIAGNOSIS — I618 Other nontraumatic intracerebral hemorrhage: Secondary | ICD-10-CM

## 2023-11-22 DIAGNOSIS — I110 Hypertensive heart disease with heart failure: Secondary | ICD-10-CM

## 2023-11-22 DIAGNOSIS — R29705 NIHSS score 5: Secondary | ICD-10-CM

## 2023-11-22 DIAGNOSIS — R220 Localized swelling, mass and lump, head: Secondary | ICD-10-CM | POA: Diagnosis not present

## 2023-11-22 LAB — CBC
HCT: 56.6 % — ABNORMAL HIGH (ref 39.0–52.0)
Hemoglobin: 19.4 g/dL — ABNORMAL HIGH (ref 13.0–17.0)
MCH: 31.6 pg (ref 26.0–34.0)
MCHC: 34.3 g/dL (ref 30.0–36.0)
MCV: 92.2 fL (ref 80.0–100.0)
Platelets: 141 K/uL — ABNORMAL LOW (ref 150–400)
RBC: 6.14 MIL/uL — ABNORMAL HIGH (ref 4.22–5.81)
RDW: 14 % (ref 11.5–15.5)
WBC: 12.5 K/uL — ABNORMAL HIGH (ref 4.0–10.5)
nRBC: 0 % (ref 0.0–0.2)

## 2023-11-22 LAB — HEPATIC FUNCTION PANEL
ALT: 24 U/L (ref 0–44)
AST: 30 U/L (ref 15–41)
Albumin: 3.4 g/dL — ABNORMAL LOW (ref 3.5–5.0)
Alkaline Phosphatase: 56 U/L (ref 38–126)
Bilirubin, Direct: 0.2 mg/dL (ref 0.0–0.2)
Indirect Bilirubin: 2.2 mg/dL — ABNORMAL HIGH (ref 0.3–0.9)
Total Bilirubin: 2.4 mg/dL — ABNORMAL HIGH (ref 0.0–1.2)
Total Protein: 6.5 g/dL (ref 6.5–8.1)

## 2023-11-22 LAB — URINALYSIS, W/ REFLEX TO CULTURE (INFECTION SUSPECTED)
Bacteria, UA: NONE SEEN
Bilirubin Urine: NEGATIVE
Glucose, UA: NEGATIVE mg/dL
Hgb urine dipstick: NEGATIVE
Ketones, ur: 5 mg/dL — AB
Leukocytes,Ua: NEGATIVE
Nitrite: NEGATIVE
Protein, ur: 30 mg/dL — AB
Specific Gravity, Urine: >1.046 — ABNORMAL HIGH (ref 1.005–1.030)
pH: 5 (ref 5.0–8.0)

## 2023-11-22 LAB — LIPID PANEL
Cholesterol: 81 mg/dL (ref 0–200)
HDL: 32 mg/dL — ABNORMAL LOW (ref >40)
LDL Cholesterol: 47 mg/dL (ref 0–99)
Total CHOL/HDL Ratio: 2.5 ratio
Triglycerides: 11 mg/dL (ref <150)
VLDL: 2 mg/dL (ref 0–40)

## 2023-11-22 LAB — BASIC METABOLIC PANEL WITH GFR
Anion gap: 9 (ref 5–15)
BUN: 14 mg/dL (ref 8–23)
CO2: 25 mmol/L (ref 22–32)
Calcium: 8.8 mg/dL — ABNORMAL LOW (ref 8.9–10.3)
Chloride: 103 mmol/L (ref 98–111)
Creatinine, Ser: 1.02 mg/dL (ref 0.61–1.24)
GFR, Estimated: >60 mL/min (ref >60)
Glucose, Bld: 167 mg/dL — ABNORMAL HIGH (ref 70–99)
Potassium: 4.2 mmol/L (ref 3.5–5.1)
Sodium: 137 mmol/L (ref 135–145)

## 2023-11-22 LAB — ECHOCARDIOGRAM COMPLETE
AR max vel: 2.34 cm2
AV Area VTI: 2.44 cm2
AV Area mean vel: 2.37 cm2
AV Mean grad: 3.5 mmHg
AV Peak grad: 7.5 mmHg
Ao pk vel: 1.37 m/s
Area-P 1/2: 3.17 cm2
S' Lateral: 2.5 cm
Weight: 2825.42 [oz_av]

## 2023-11-22 LAB — GLUCOSE, CAPILLARY
Glucose-Capillary: 132 mg/dL — ABNORMAL HIGH (ref 70–99)
Glucose-Capillary: 151 mg/dL — ABNORMAL HIGH (ref 70–99)
Glucose-Capillary: 168 mg/dL — ABNORMAL HIGH (ref 70–99)
Glucose-Capillary: 171 mg/dL — ABNORMAL HIGH (ref 70–99)
Glucose-Capillary: 187 mg/dL — ABNORMAL HIGH (ref 70–99)
Glucose-Capillary: 194 mg/dL — ABNORMAL HIGH (ref 70–99)

## 2023-11-22 LAB — MAGNESIUM: Magnesium: 1.8 mg/dL (ref 1.7–2.4)

## 2023-11-22 LAB — PHOSPHORUS: Phosphorus: 3.9 mg/dL (ref 2.5–4.6)

## 2023-11-22 MED ORDER — AMLODIPINE BESYLATE 5 MG PO TABS
5.0000 mg | ORAL_TABLET | Freq: Every day | ORAL | Status: DC
  Administered 2023-11-22 – 2023-11-23 (×2): 5 mg via ORAL
  Filled 2023-11-22 (×2): qty 1

## 2023-11-22 MED ORDER — LOSARTAN POTASSIUM 50 MG PO TABS
50.0000 mg | ORAL_TABLET | Freq: Every day | ORAL | Status: DC
  Administered 2023-11-22 – 2023-11-23 (×2): 50 mg via ORAL
  Filled 2023-11-22 (×2): qty 1

## 2023-11-22 MED ORDER — IOHEXOL 350 MG/ML SOLN
75.0000 mL | Freq: Once | INTRAVENOUS | Status: AC | PRN
  Administered 2023-11-22: 75 mL via INTRAVENOUS

## 2023-11-22 MED ORDER — AMLODIPINE BESYLATE 5 MG PO TABS
5.0000 mg | ORAL_TABLET | Freq: Once | ORAL | Status: AC
  Administered 2023-11-22: 5 mg via ORAL
  Filled 2023-11-22: qty 1

## 2023-11-22 NOTE — Progress Notes (Addendum)
 STROKE TEAM PROGRESS NOTE   INTERIM HISTORY/SUBJECTIVE  Family at the bedside.  Patient is awake alert sitting up in bed. He states yesterday he was having trouble with his legs.  CT head with right thalamus and intraventricular hemorrhage.  CTA with no LVO and stable right thalamic and intraventricular hemorrhage without hydrocephalus.  No spot sign. MRI brain pending.  Blood pressure adequately controlled.  CBC    Component Value Date/Time   WBC 12.5 (H) 11/22/2023 0609   RBC 6.14 (H) 11/22/2023 0609   HGB 19.4 (H) 11/22/2023 0609   HCT 56.6 (H) 11/22/2023 0609   PLT 141 (L) 11/22/2023 0609   MCV 92.2 11/22/2023 0609   MCH 31.6 11/22/2023 0609   MCHC 34.3 11/22/2023 0609   RDW 14.0 11/22/2023 0609   LYMPHSABS 0.5 (L) 01/22/2016 0547   MONOABS 0.2 01/22/2016 0547   EOSABS 0.0 01/22/2016 0547   BASOSABS 0.0 01/22/2016 0547    BMET    Component Value Date/Time   NA 137 11/22/2023 0609   K 4.2 11/22/2023 0609   CL 103 11/22/2023 0609   CO2 25 11/22/2023 0609   GLUCOSE 167 (H) 11/22/2023 0609   BUN 14 11/22/2023 0609   CREATININE 1.02 11/22/2023 0609   CALCIUM 8.8 (L) 11/22/2023 0609   GFRNONAA >60 11/22/2023 0609    IMAGING past 24 hours CT HEAD WO CONTRAST ( ) Result Date: 11/22/2023 EXAM: CT HEAD WITHOUT CONTRAST 11/22/2023 04:49:10 AM TECHNIQUE: CT of the head was performed without the administration of intravenous contrast. Automated exposure control, iterative reconstruction, and/or weight based adjustment of the mA/kV was utilized to reduce the radiation dose to as low as reasonably achievable. COMPARISON: CT head without contrast 11/21/2023 at her and off hospital. CLINICAL HISTORY: Stroke, hemorrhagic (Ped 0-17y); basal ganglia with intraventricular hemorrhage. FINDINGS: BRAIN AND VENTRICLES: 3 cm hemorrhage in the right thalamus is stable. Intraventricular hemorrhage right greater than left is similar to the prior study. Ventricle size is stable. Periventricular  white matter hypoattenuation is moderately advanced for age. New multifocal subarachnoid hemorrhage is present bilaterally. Most prominent areas are over the left parietal lobe, right frontal and perculum and in the occipital lobes bilaterally. ORBITS: No acute abnormality. SINUSES: No acute abnormality. SOFT TISSUES AND SKULL: Scalp soft tissue swelling is present near the vertex. No underlying fracture or foreign body is present. VASCULATURE: Atherosclerotic calcifications are present in the cavernous carotid arteries bilaterally. No hyperdense vessel is present. IMPRESSION: 1. Stable 3 cm hemorrhage in the right thalamus and intraventricular hemorrhage, right greater than left. 2. New multifocal subarachnoid hemorrhage bilaterally, most prominent over the left parietal lobe, right frontal and perculum, and in the occipital lobes bilaterally. 3. Scalp soft tissue swelling near the vertex. No underlying fracture or foreign body is present. 4. Moderately advanced periventricular white matter hypoattenuation for age. 5. Atherosclerotic calcifications in the cavernous carotid arteries bilaterally. No hyperdense vessel is present. Electronically signed by: Lonni Necessary MD 11/22/2023 05:49 AM EDT RP Workstation: [id number]   US  Abdomen Limited RUQ (LIVER/GB) Result Date: 11/22/2023 CLINICAL DATA:  73059 Hyperbilirubinemia 26940 EXAM: ULTRASOUND ABDOMEN LIMITED RIGHT UPPER QUADRANT COMPARISON:  None Available. FINDINGS: Gallbladder: No gallstones or wall thickening visualized. No sonographic Murphy sign noted by sonographer. Common bile duct: Diameter: 4 mm Liver: No focal lesion identified. Within normal limits in parenchymal echogenicity. Portal vein is patent on color Doppler imaging with normal direction of blood flow towards the liver. Other: None. IMPRESSION: 1. Normal right upper quadrant sonogram. Electronically Signed   By:  Dorethia Molt M.D.   On: 11/22/2023 00:25   DG CHEST PORT 1 VIEW Result  Date: 11/21/2023 CLINICAL DATA:  200808.  Hypoxia. EXAM: PORTABLE CHEST 1 VIEW COMPARISON:  PA Lat chest 02/05/2016 FINDINGS: Mild cardiomegaly. The mediastinum is normally outlined. There is calcification in the aortic arch. There is increased central vascular engorgement. There is increased central interstitial edema as well. Trace pleural effusions are beginning to form. Findings superimposed on mild fibrosis. There is asymmetric haziness in the periphery of the right upper lobe which could be due to asymmetric edema or pneumonia. No other focal infiltrate is seen. There is thoracic spondylosis. Bilateral shoulder DJD. IMPRESSION: 1. Cardiomegaly with increased central vascular engorgement and central interstitial edema. 2. Asymmetric haziness in the periphery of the right upper lobe which could be due to asymmetric ground-glass edema or pneumonia. 3. Trace pleural effusions. 4. Aortic atherosclerosis. Electronically Signed   By: Francis Quam M.D.   On: 11/21/2023 21:57    Vitals:   11/22/23 0500 11/22/23 0600 11/22/23 0700 11/22/23 0800  BP: (!) 137/91 (!) 141/91 (!) 146/87 (!) 147/90  Pulse: 63 65 67 66  Resp: 19 18 18 18   Temp:      TempSrc:      SpO2: 92% 93% 95% 95%  Weight:         PHYSICAL EXAM General:  Alert, well-nourished, well-developed patient in no acute distress Psych:  Mood and affect appropriate for situation CV: Regular rate and rhythm on monitor Respiratory:  Regular, unlabored respirations on room air GI: Abdomen soft and nontender   NEURO:  Mental Status: AA&Ox3, patient is able to give clear and coherent history Speech/Language: speech is without dysarthria or aphasia.  Naming, repetition, fluency, and comprehension intact.  Cranial Nerves:  II: PERRL. Visual fields full.  III, IV, VI: EOMI. Eyelids elevate symmetrically.  V: Sensation is intact to light touch and symmetrical to face.  VII: Face is symmetrical resting and smiling VIII: hearing intact to  voice. IX, X: Palate elevates symmetrically. Phonation is normal.  KP:Dynloizm shrug 5/5. XII: tongue is midline without fasciculations. Motor: 5/5 strength in right arm and leg, left arm and leg with slight drift Tone: is normal and bulk is normal Sensation- Intact to light touch bilaterally. Extinction absent to light touch to DSS.   Coordination: Ataxia on left arm Gait- deferred  Most Recent NIH  1a Level of Conscious.:  1b LOC Questions:  1c LOC Commands:  2 Best Gaze:  3 Visual:  4 Facial Palsy:  5a Motor Arm - left: 1 5b Motor Arm - Right:  6a Motor Leg - Left: 1 6b Motor Leg - Right:  7 Limb Ataxia: 1 8 Sensory:  9 Best Language:  10 Dysarthria:  11 Extinct. and Inatten.:  TOTAL: 3   ASSESSMENT/PLAN  [name] is a 73 y.o. male with history of COPD, heart failure, presents with left-sided weakness that started abruptly   NIH on Admission 5  Intracerebral Hemorrhage and IVH  right thalamus, multifocal subarachnoid hemorrhage  Etiology: Likely hypertensive CT head Stable 3 cm hemorrhage in the right thalamus and intraventricular hemorrhage, right greater than left. 2. New multifocal subarachnoid hemorrhage bilaterally, most prominent over the left parietal lobe, right frontal and perculum, and in the occipital lobes bilaterally. CTA head & neck no LVO MRI ordered 2D Echo ordered LDL 47 HgbA1c pending VTE prophylaxis -SCDs aspirin  81 mg daily prior to admission, now on No antithrombotic due to acute hemorrhage Therapy  recommendations:  Pending Disposition: Pending  Hypertension HFp EF Home meds: None Started on Norvasc  5 mg, losartan  50 mg Beta-blocker as needed to maintain BP goal Blood Pressure Goal: SBP between 130-150 for 24 hours and then less than 160   Hyperlipidemia Home meds: None,  LDL 47 goal < 70   Acute on chronic respiratory failure with hypoxia On home O2  Dysphagia Patient has post-stroke dysphagia, SLP consulted    Diet    Diet Heart Room service appropriate? Yes; Fluid consistency: Thin   Advance diet as tolerated  Other Stroke Risk Factors Congestive heart failure   Other Active Problems Leukocytosis-WBC 12.5, afebrile Polycythemia-Hgb 19.4, HCT 56  Hospital day # 1  Karna Geralds DNP, ACNPC-AG  [facility]  I have personally obtained history,examined this patient, reviewed notes, independently viewed imaging studies, participated in medical decision making and plan of care.ROS completed by me personally and pertinent positives fully documented  I have made any additions or clarifications directly to the above note. Agree with note above.  Patient presented with right thalamic and intraventricular hemorrhage likely due to uncontrolled hypertension.  Continue strict blood pressure control with systolic goal between 130-150 for the first 24 hours and then below 160.  Mobilize out of bed.  Physical Occupational Therapy consults.  Check MRI scan of the brain tomorrow.  Long discussion with patient and family at the bedside and answered questions.  Discussed with critical care team. This patient is critically ill and at significant risk of neurological worsening, death and care requires constant monitoring of vital signs, hemodynamics,respiratory and cardiac monitoring, extensive review of multiple databases, frequent neurological assessment, discussion with family, other specialists and medical decision making of high complexity.I have made any additions or clarifications directly to the above note.This critical care time does not reflect procedure time, or teaching time or supervisory time of PA/NP/Med Resident etc but could involve care discussion time.  I spent 30 minutes of neurocritical care time  in the care of  this patient.      Eather Popp, MD Medical Director [facility] Pager: [phone] 11/22/2023 4:31 PM   To contact Stroke Continuity provider, please refer to  [email or web address]. After hours, contact General Neurology

## 2023-11-22 NOTE — Progress Notes (Signed)
  Echocardiogram 2D Echocardiogram has been performed.  Zachary Brown 11/22/2023, 4:38 PM

## 2023-11-22 NOTE — Consult Note (Signed)
 CC: Syncopal episode and weakness  HPI:     Patient is a 73 y.o. male with history of heart failure who developed sudden onset of left-sided weakness with fall.  He was found to have a left thalamic hemorrhage consistent with a hypertensive hemorrhage.  He was taking aspirin .  He was transferred to [facility] for further care from [place].  On questioning, patient states he has a mild headache which is not bothersome.  No nausea or vomiting.    Patient Active Problem List   Diagnosis Date Noted   Intraparenchymal hematoma of brain (HCC) 11/21/2023   Diabetes (HCC) 01/30/2016   Pulmonary emphysema (HCC)    Acute on chronic respiratory failure with hypoxia (HCC)    Hypoxia    ILD (interstitial lung disease) (HCC)    Acute hypoxemic respiratory failure (HCC) 01/21/2016   Past Medical History:  Diagnosis Date   Chronic respiratory failure with hypoxia (HCC)    Heart failure (HCC)    Pneumonia 1956   severe   Snake bite 1980   copper heaed    History reviewed. No pertinent surgical history.  Medications Prior to Admission  Medication Sig Dispense Refill Last Dose/Taking   albuterol  (PROVENTIL  HFA;VENTOLIN  HFA) 108 (90 Base) MCG/ACT inhaler Inhale 1 puff into the lungs every 6 (six) hours as needed for wheezing or shortness of breath.      aspirin  EC 81 MG tablet Take 81 mg by mouth 4 (four) times a week.       furosemide  (LASIX ) 20 MG tablet Take 1 tablet by mouth daily.  2    metFORMIN  (GLUCOPHAGE ) 500 MG tablet Take 1 tablet (500 mg total) by mouth 2 (two) times daily with a meal. 60 tablet 2    No Known Allergies  Social History   Tobacco Use   Smoking status: Former    Current packs/day: 0.00    Average packs/day: 1 pack/day for 20.0 years (20.0 ttl pk-yrs)    Types: Cigarettes    Start date: 01/21/1961    Quit date: 01/21/1981    Years since quitting: 42.8   Smokeless tobacco: Never  Substance Use Topics   Alcohol use: No    Family History  Problem Relation Age of  Onset   Heart attack Father    Asthma Sister      Review of Systems Pertinent items are noted in HPI.  Objective:   Patient Vitals for the past 8 hrs:  BP Temp Temp src Pulse Resp SpO2 Weight  11/22/23 1100 (!) 149/95 -- -- 71 14 96 % --  11/22/23 1000 (!) 151/85 -- -- -- (!) 32 -- --  11/22/23 0900 (!) 145/86 -- -- 80 (!) 26 (!) 89 % --  11/22/23 0800 (!) 147/90 -- -- 66 18 95 % --  11/22/23 0700 (!) 146/87 -- -- 67 18 95 % --  11/22/23 0600 (!) 141/91 -- -- 65 18 93 % --  11/22/23 0500 (!) 137/91 -- -- 63 19 92 % --  11/22/23 0430 134/88 -- -- 64 16 91 % --  11/22/23 0400 (!) 150/102 97.6 F (36.4 C) Axillary 75 18 91 % --  11/22/23 0345 -- -- -- -- -- -- 80.1 kg   No intake/output data recorded. Total I/O In: 75 [P.O.:75] Out: -       General : Alert, cooperative, no distress, appears stated age   Head:  Normocephalic/atraumatic    Eyes: PERRL, conjunctiva/corneas clear, EOM's intact. Fundi could not be visualized Neck:  Supple Chest:  Respirations unlabored Chest wall: no tenderness or deformity Heart: Regular rate and rhythm Abdomen: Soft, nontender and nondistended Extremities: warm and well-perfused Skin: normal turgor, color and texture Neurologic:  Alert, oriented x 3.  Eyes open spontaneously. PERRL, EOMI, VFC, no facial droop. V1-3 intact.  No dysarthria, tongue protrusion symmetric.  CNII-XII intact.  Left-sided pronator drift.  4/5 strength in left lower extremity.       Data ReviewCBC:  Lab Results  Component Value Date   WBC 12.5 (H) 11/22/2023   RBC 6.14 (H) 11/22/2023   BMP:  Lab Results  Component Value Date   GLUCOSE 167 (H) 11/22/2023   CO2 25 11/22/2023   BUN 14 11/22/2023   CREATININE 1.02 11/22/2023   CALCIUM 8.8 (L) 11/22/2023   Radiology review:  Initial CT head without contrast performed at [facility] and repeat CT head performed this morning was reviewed.  There is a left thalamic intracranial hemorrhage with intraventricular  extension.  No hydrocephalus is apparent.  No transependymal flow.  The hemorrhage is stable on repeat CT.  Assessment:   Principal Problem:   Intraparenchymal hematoma of brain [facility]) This is a 73 year old man with a hypertensive left thalamic hemorrhage with intraventricular hemorrhage but no hydrocephalus.  Plan:  - As his repeat head CT is stable, he is unlikely to require any neurosurgical procedures.  I discussed the plan of care with his family at the bedside.  Please call with questions.

## 2023-11-22 NOTE — Progress Notes (Signed)
 NAME:  Zachary Brown, MRN:  [id number], DOB:  31-Mar-1951, LOS: 1 ADMISSION DATE:  11/21/2023, CONSULTATION DATE:  8/8 REFERRING MD:  Dr Charlesetta [facility] ED), CHIEF COMPLAINT:  ICH   History of Present Illness:  73 year old male with PMH as below who presented to [facility] 8/8 after being found on the floor by his daughter. He reports having possibly falling although he does not remember the events surrounding the incident, but feels as though he did not hit his head. He estimates being on the floor for approximately three hours prior to being found by his daughter. ED documentation reports the patient walking and left leg growing weak as well as Left arm numbness prior to fall. . Other areas on injury include the left forearm. Upon arrival to the ED he was noted to have left sided weakness and was immediatly taken for CT of the head. Found to have right sided basal ganglia IPH with intraventricular extension. He was started on nicardipine infusion for blood pressure control . Neurosurgery at [facility] was consulted and recommended transfer for neuro ICU admission. PCCM accepted the patient for transfer.   Pertinent  Medical History   has a past medical history of Chronic respiratory failure with hypoxia (HCC), Heart failure (HCC), Pneumonia (1956), and Snake bite (1980). CHF (Grade 1 DD on echo from 2017)   Significant Hospital Events: Including procedures, antibiotic start and stop dates in addition to other pertinent events   8/8 admit for IPH Head CT 09/22/2023 >> stable 3 cm right thalamic hemorrhage with IVH right greater than left, new multifocal subarachnoid hemorrhage bilaterally most prominent left parietal, right frontal and bilateral occipital lobes.  Scalp soft tissue swelling near the vertex without a fracture PFT 02/2016 >FEV175%, ratio 87, FVC 64%, no sign BD response, TLC 68%, DLCO 22%.   Interim History / Subjective:  Nasal cannula O2 5 L/min SBP 127-161, not currently on  nicardipine WBC 12.5 Repeat head CT done on early this morning as above   Objective    Blood pressure (!) 141/91, pulse 65, temperature 97.6 F (36.4 C), temperature source Axillary, resp. rate 18, weight 80.1 kg, SpO2 93%.       No intake or output data in the 24 hours ending 11/22/23 0725 Filed Weights   11/22/23 0345  Weight: 80.1 kg    Examination: General: Elderly gentleman laying in bed, comfortable, no distress HENT: Oropharynx clear, strong voice, no secretions Lungs: Clear bilaterally Cardiovascular: Regular, no murmur Abdomen: Soft, benign, positive bowel sounds Extremities: No deformity Neuro: Awake, alert, appropriate.  Answers questions.  Tongue midline.  Pupils are equal and react briskly.  Overall strength is equal.  Resolved problem list   Assessment and Plan    Intraparenchymal hemorrhage: right basal ganglia with extension into the right lateral and third ventricles. Concern for hypertensive bleed based on location, but did fall and he has scalp soft tissue injury.  Intraventricular hemorrhage Subarachnoid hemorrhage - Neurosurgery to see on 8/9.  A.m. CT stable 8/9 - Goal SBP < 140 -Labetalol  available if needed - Add amlodipine  on 8/9 - Initiate nicardipine if unable to maintain SBP goals - Seizure precautions and neuroprotective measures - Heart healthy diet initiated and as there did not seem to be any urgent need for surgical intervention  Possible ILD: started workup in pulm office 2017 but lost to follow up.  Acute on chronic respiratory failure with hypoxia -Has not had access to his home oxygen, concentrator has been  broken.  Will need to try to reestablish - Once stabilized neurologically would obtain a high-resolution CT chest without contrast - Wean O2 as able, he has baseline (as he knew it) was 2 L/min, currently 5 L/min  Hx HFpEF ? Syncope -Judicious IV fluids - Echocardiogram ordered and pending - Home O2 on hold  Elevated  bilirubin -Right upper quadrant ultrasound ordered and pending  DM -Metformin  is on his medication list, question whether he is been taking it.  Will try to clarify - CBG monitoring, sliding scale insulin  available   Best Practice (right click and Reselect all SmartList Selections daily)   Diet/type: Regular consistency (see orders) DVT prophylaxis SCD Pressure ulcer(s): pressure ulcer assessment deferred  GI prophylaxis: N/A Lines: N/A Foley:  N/A Code Status:  full code Last date of multidisciplinary goals of care discussion [ ]   Labs   CBC: Recent Labs  Lab 11/22/23 0609  WBC 12.5*  HGB 19.4*  HCT 56.6*  MCV 92.2  PLT 141*    Basic Metabolic Panel: Recent Labs  Lab 11/22/23 0609  NA 137  K 4.2  CL 103  CO2 25  GLUCOSE 167*  BUN 14  CREATININE 1.02  CALCIUM 8.8*  MG 1.8  PHOS 3.9   GFR: CrCl cannot be calculated (Unknown ideal weight.). Recent Labs  Lab 11/22/23 0609  WBC 12.5*    Liver Function Tests: Recent Labs  Lab 11/22/23 0609  AST 30  ALT 24  ALKPHOS 56  BILITOT 2.4*  PROT 6.5  ALBUMIN 3.4*   No results for input(s): LIPASE, AMYLASE in the last 168 hours. No results for input(s): AMMONIA in the last 168 hours.  ABG    Component Value Date/Time   PHART 7.406 01/21/2016 2141   PCO2ART 90.4 (HH) 01/21/2016 2141   PO2ART 55.9 (L) 01/21/2016 2141   HCO3 55.6 (H) 01/21/2016 2141   O2SAT 88.0 01/21/2016 2141     Coagulation Profile: No results for input(s): INR, PROTIME in the last 168 hours.  Cardiac Enzymes: No results for input(s): CKTOTAL, CKMB, CKMBINDEX, TROPONINI in the last 168 hours.  HbA1C: Hgb A1c MFr Bld  Date/Time Value Ref Range Status  01/29/2016 02:43 PM 8.0 (H) 4.8 - 5.6 % Final    Comment:    (NOTE)         Pre-diabetes: 5.7 - 6.4         Diabetes: >6.4         Glycemic control for adults with diabetes: <7.0     CBG: Recent Labs  Lab 11/21/23 2338 11/22/23 0342 11/22/23 0721   GLUCAP 160* 168* 171*    Critical care time: 31 minutes     Lamar Chris, MD, PhD 11/22/2023, 7:25 AM [facility] [phone] or if no answer before 7:00PM call [phone] For any issues after 7:00PM please call eLink [phone]

## 2023-11-22 NOTE — Consult Note (Addendum)
 NEUROLOGY CONSULT NOTE   Date of service: November 22, 2023 Patient Name: Zachary Brown MRN:  [id number] DOB:  12/05/1950 Chief Complaint: left sided weakness Requesting Provider: Gretta Leita SQUIBB, DO  History of Present Illness  Zachary Brown is a 73 y.o. male with hx of heart failure who presents with left-sided weakness that started abruptly around 10 AM.  He states that he walked his dog earlier in the day, and fell but did not hit his head.  Subsequently, he noticed left-sided weakness and sought care at [facility] emergency department.  There head CT revealed intracranial hemorrhage and his case was discussed with both neurosurgery and critical care here, and he was admitted to the critical care service for further management.  LKW: 10 AM IV Thrombolysis: No, ICH EVT: No, ICH ICH Score: 1  NIHSS components Score: Comment  1a Level of Conscious 0[x]  1[]  2[]  3[]      1b LOC Questions 0[x]  1[]  2[]       1c LOC Commands 0[x]  1[]  2[]       2 Best Gaze 0[x]  1[]  2[]       3 Visual 0[x]  1[]  2[]  3[]      4 Facial Palsy 0[x]  1[]  2[]  3[]      5a Motor Arm - left 0[]  1[]  2[x]  3[]  4[]  UN[]    5b Motor Arm - Right 0[x]  1[]  2[]  3[]  4[]  UN[]    6a Motor Leg - Left 0[]  1[]  2[x]  3[]  4[]  UN[]    6b Motor Leg - Right 0[x]  1[]  2[]  3[]  4[]  UN[]    7 Limb Ataxia 0[x]  1[]  2[]  UN[]      8 Sensory 0[x]  1[]  2[]  UN[]      9 Best Language 0[x]  1[]  2[]  3[]      10 Dysarthria 0[x]  1[]  2[]  UN[]      11 Extinct. and Inattention 0[]  1[x]  2[]     Extinguisehes to DSS in arms and legs  TOTAL:        Past History   Past Medical History:  Diagnosis Date   Chronic respiratory failure with hypoxia (HCC)    Heart failure (HCC)    Pneumonia 1956   severe   Snake bite 1980   copper heaed    History reviewed. No pertinent surgical history.  Family History: Family History  Problem Relation Age of Onset   Heart attack Father    Asthma Sister     Social History  reports that he quit smoking about 42 years ago. His  smoking use included cigarettes. He started smoking about 62 years ago. He has a 20 pack-year smoking history. He has never used smokeless tobacco. He reports that he does not drink alcohol and does not use drugs.  No Known Allergies  Medications   Current Facility-Administered Medications:    acetaminophen  (TYLENOL ) tablet 650 mg, 650 mg, Oral, Q4H PRN, Gretta Leita P, DO   Chlorhexidine  Gluconate Cloth 2 % PADS 6 each, 6 each, Topical, Q0600, Byrum, Robert S, MD   docusate sodium  (COLACE) capsule 100 mg, 100 mg, Oral, BID PRN, Gretta Leita P, DO   famotidine  (PEPCID ) tablet 20 mg, 20 mg, Oral, BID, Gretta Leita SQUIBB, DO, 20 mg at 11/21/23 2156   insulin  aspart (novoLOG ) injection 0-6 Units, 0-6 Units, Subcutaneous, Q4H, Gretta Leita SQUIBB, DO, 1 Units at 11/22/23 0344   labetalol  (NORMODYNE ) injection 10 mg, 10 mg, Intravenous, Q10 min PRN, Rosan Deward ORN, NP, 10 mg at 11/22/23 0127   ondansetron  (ZOFRAN ) injection 4 mg, 4 mg, Intravenous, Q6H PRN, Gretta Leita SQUIBB,  DO, 4 mg at 11/22/23 0350   Oral care mouth rinse, 15 mL, Mouth Rinse, PRN, Byrum, Robert S, MD   polyethylene glycol (MIRALAX  / GLYCOLAX ) packet 17 g, 17 g, Oral, Daily PRN, Gretta Leita SQUIBB, DO  Vitals   Vitals:   11/22/23 0130 11/22/23 0200 11/22/23 0345 11/22/23 0400  BP: (!) 142/90 137/81    Pulse: 75 71    Resp: 20 17    Temp:    97.6 F (36.4 C)  TempSrc:    Axillary  SpO2: 91% 91%    Weight:   80.1 kg     Body mass index is 26.08 kg/m.   Physical Exam   Constitutional: Appears well-developed and well-nourished.   Neurologic Examination    Neuro: Mental Status: Patient is awake, alert, oriented to person, place, month, year, and situation. Patient is able to give a clear and coherent history. No signs of aphasia, he does extinguish to double simultaneous stimulation on the left Cranial Nerves: II: Visual Fields are full. Pupils are equal, round, and reactive to light.   III,IV, VI: EOMI without ptosis or  diploplia.  V: Facial sensation is symmetric to temperature VII: Facial movement is symmetric.  VIII: hearing is intact to voice X: Uvula elevates symmetrically XII: tongue is midline without atrophy or fasciculations.  Motor: Tone is normal. Bulk is normal. 5/5 strength was present on the right, on the left he has 4 -/5 weakness of the arm and leg Sensory: Sensation is symmetric to light touch and temperature in the arms and legs. Cerebellar: Consistent with weakness on the left       Labs/Imaging/Neurodiagnostic studies   Basic Metabolic Panel:  Lab Results  Component Value Date   NA 135 01/29/2016   K 4.7 01/29/2016   CO2 25 01/29/2016   GLUCOSE 84 01/29/2016   BUN 28 (H) 01/29/2016   CREATININE 0.72 01/29/2016   CALCIUM 8.6 (L) 01/29/2016   GFRNONAA >60 01/29/2016   GFRAA >60 01/29/2016   Lipid Panel: No results found for: LDLCALC HgbA1c:  Lab Results  Component Value Date   HGBA1C 8.0 (H) 01/29/2016    Alcohol Level No results found for: [facility] INR  Lab Results  Component Value Date   INR 1.16 01/22/2016   APTT  Lab Results  Component Value Date   APTT 32 01/22/2016   CT Head without contrast(Personally reviewed): Deep subcortical intracranial hemorrhage on the right with extension into the lateral ventricle  ASSESSMENT   Zachary Brown is a 73 y.o. male with subcortical hemorrhage with intraventricular extension.  I doubt that this is traumatic, in favor either hypertensive or ischemic with conversion.  His BP is relatively well-controlled and I would continue to maintain it strictly less than 150.  RECOMMENDATIONS  1) repeat CT in the am 2) no antiplatelets or anticoagulants 3) blood pressure control with goal systolic 130 - 150 4) Frequent neuro checks 5) If symptoms worsen or there is decreased mental status, repeat stat head CT 6) PT,OT,ST 7) stroke chemo  follow ______________________________________________________________________    Signed, Aisha Seals, MD [facility]  I spent 45 minutes in the care of this patient

## 2023-11-23 ENCOUNTER — Inpatient Hospital Stay (HOSPITAL_COMMUNITY)

## 2023-11-23 DIAGNOSIS — S06310A Contusion and laceration of right cerebrum without loss of consciousness, initial encounter: Secondary | ICD-10-CM | POA: Diagnosis not present

## 2023-11-23 LAB — GLUCOSE, CAPILLARY
Glucose-Capillary: 154 mg/dL — ABNORMAL HIGH (ref 70–99)
Glucose-Capillary: 164 mg/dL — ABNORMAL HIGH (ref 70–99)
Glucose-Capillary: 165 mg/dL — ABNORMAL HIGH (ref 70–99)
Glucose-Capillary: 169 mg/dL — ABNORMAL HIGH (ref 70–99)
Glucose-Capillary: 176 mg/dL — ABNORMAL HIGH (ref 70–99)

## 2023-11-23 LAB — ERYTHROPOIETIN: Erythropoietin: 4 m[IU]/mL (ref 2.6–18.5)

## 2023-11-23 MED ORDER — LOSARTAN POTASSIUM 50 MG PO TABS
50.0000 mg | ORAL_TABLET | Freq: Once | ORAL | Status: AC
  Administered 2023-11-23: 50 mg via ORAL
  Filled 2023-11-23: qty 1

## 2023-11-23 MED ORDER — LOSARTAN POTASSIUM 50 MG PO TABS: 100.0000 mg | ORAL_TABLET | Freq: Every day | ORAL | Status: DC

## 2023-11-23 MED ORDER — LABETALOL HCL 5 MG/ML IV SOLN
10.0000 mg | INTRAVENOUS | Status: DC | PRN
  Administered 2023-11-23 – 2023-11-24 (×4): 10 mg via INTRAVENOUS
  Filled 2023-11-23 (×3): qty 4

## 2023-11-23 NOTE — Progress Notes (Addendum)
 STROKE TEAM PROGRESS NOTE   INTERIM HISTORY/SUBJECTIVE  Family at the bedside.  Patient is awake alert sitting up in bed. He states he is doing better.  He was able to ambulate with therapy.  He has no complaints.  MRIs are pending.  Blood pressure adequately controlled.  CBC    Component Value Date/Time   WBC 12.5 (H) 11/22/2023 0609   RBC 6.14 (H) 11/22/2023 0609   HGB 19.4 (H) 11/22/2023 0609   HCT 56.6 (H) 11/22/2023 0609   PLT 141 (L) 11/22/2023 0609   MCV 92.2 11/22/2023 0609   MCH 31.6 11/22/2023 0609   MCHC 34.3 11/22/2023 0609   RDW 14.0 11/22/2023 0609   LYMPHSABS 0.5 (L) 01/22/2016 0547   MONOABS 0.2 01/22/2016 0547   EOSABS 0.0 01/22/2016 0547   BASOSABS 0.0 01/22/2016 0547    BMET    Component Value Date/Time   NA 137 11/22/2023 0609   K 4.2 11/22/2023 0609   CL 103 11/22/2023 0609   CO2 25 11/22/2023 0609   GLUCOSE 167 (H) 11/22/2023 0609   BUN 14 11/22/2023 0609   CREATININE 1.02 11/22/2023 0609   CALCIUM 8.8 (L) 11/22/2023 0609   GFRNONAA >60 11/22/2023 0609    IMAGING past 24 hours DG Abd 1 View Result Date: 11/23/2023 CLINICAL DATA:  [id number]. Screening for metal prior to MRI. MRI clearance film. EXAM: ABDOMEN - 1 VIEW COMPARISON:  None Available. FINDINGS: The bowel gas pattern is normal. No radio-opaque calculi or other significant radiographic abnormality are seen. There are no radiopaque foreign bodies in the field, but please note that a portion of the left lateral hemiabdomen was excluded by collimation. There are overlying telemetry leads.  Lumbar spondylosis. IMPRESSION: 1. No radiopaque foreign bodies in the field, but please note that a portion of the left lateral hemiabdomen was excluded by collimation. 2. Lumbar spondylosis. Electronically Signed   By: Francis Quam M.D.   On: 11/23/2023 06:59   CT HEAD WO CONTRAST ( ) Result Date: 11/22/2023 CLINICAL DATA:  Follow-up examination for intracranial hemorrhage. EXAM: CT HEAD WITHOUT CONTRAST  TECHNIQUE: Contiguous axial images were obtained from the base of the skull through the vertex without intravenous contrast. RADIATION DOSE REDUCTION: This exam was performed according to the departmental dose-optimization program which includes automated exposure control, adjustment of the mA and/or kV according to patient size and/or use of iterative reconstruction technique. COMPARISON:  CT from earlier the same day. FINDINGS: Brain: Intraparenchymal hemorrhage centered at the superior right thalamus again seen, measuring slightly increased in size at 3.6 x 2.1 x 3.3 cm (estimated volume 12.5 mL). Mild surrounding edema, slightly increased from prior. Intraventricular extension with blood in the right greater than left lateral and third ventricles, similar. Ventricular size and morphology is relatively stable without overt hydrocephalus. Scattered small volume subarachnoid hemorrhage involving the bilateral cerebral hemispheres, slightly more prominent as compared to previous. No other acute intracranial hemorrhage. No mass lesion or significant midline shift. No extra-axial fluid collection. Chronic microvascular ischemic disease again noted. Vascular: Some IV contrast material remains on board related to prior CTA. Calcified atherosclerosis present at skull base. Skull: Tissues soft tissue swelling present at the scalp soft tissues near the vertex. Calvarium intact. Sinuses/Orbits: Right gaze preference noted. Paranasal sinuses and mastoid air cells are largely clear. Other: None. IMPRESSION: 1. Slight interval increase in size of intraparenchymal hemorrhage centered at the superior right thalamus, now measuring 3.6 x 2.1 x 3.3 cm (estimated volume 12.5 mL). Mild surrounding edema, slightly increased  from prior. 2. Intraventricular extension with blood in the right greater than left lateral and third ventricles, similar. Ventricular size and morphology is relatively stable without overt hydrocephalus. 3.  Scattered small volume subarachnoid hemorrhage involving the bilateral cerebral hemispheres, slightly more prominent as compared to previous. 4. No other new acute intracranial abnormality. Electronically Signed   By: Morene Hoard M.D.   On: 11/22/2023 22:04   ECHOCARDIOGRAM COMPLETE Result Date: 11/22/2023    ECHOCARDIOGRAM REPORT   Patient Name:   Zachary Brown Date of Exam: 11/22/2023 Medical Rec #:  [id number]      Height:       69.0 in Accession #:    [id number]     Weight:       176.6 lb Date of Birth:  January 24, 1951      BSA:          1.960 m Patient Age:    72 years       BP:           148/90 mmHg Patient Gender: M              HR:           71 bpm. Exam Location:  Inpatient Procedure: 2D Echo (Both Spectral and Color Flow Doppler were utilized during            procedure). Indications:    syncope  History:        Patient has prior history of Echocardiogram examinations, most                 recent 01/25/2016. Risk Factors:Diabetes.  Sonographer:    Tinnie Barefoot RDCS Referring Phys: [id number] LAURA P CLARK IMPRESSIONS  1. Left ventricular ejection fraction, by estimation, is 60 to 65%. The left ventricle has normal function. The left ventricle has no regional wall motion abnormalities. There is mild left ventricular hypertrophy. Left ventricular diastolic parameters are indeterminate. There is the interventricular septum is flattened in systole, consistent with right ventricular pressure overload.  2. Right ventricular systolic function mild to moderately reduced systolic functoin. The right ventricular size is moderate to severely enlarged. Tricuspid regurgitation signal is inadequate for assessing PA pressure.  3. The mitral valve is normal in structure. No evidence of mitral valve regurgitation. No evidence of mitral stenosis.  4. The aortic valve is tricuspid. Aortic valve regurgitation is not visualized. No aortic stenosis is present.  5. There is mild dilatation of the ascending aorta,  measuring 37 mm.  6. The inferior vena cava is normal in size with greater than 50% respiratory variability, suggesting right atrial pressure of 3 mmHg. FINDINGS  Left Ventricle: Left ventricular ejection fraction, by estimation, is 60 to 65%. The left ventricle has normal function. The left ventricle has no regional wall motion abnormalities. The left ventricular internal cavity size was normal in size. There is  mild left ventricular hypertrophy. The interventricular septum is flattened in systole, consistent with right ventricular pressure overload. Left ventricular diastolic parameters are indeterminate. Right Ventricle: The right ventricular size is moderate to severely enlarged. Right vetricular wall thickness was not well visualized. Right ventricular systolic function mild to moderately reduced systolic functoin. Tricuspid regurgitation signal is inadequate for assessing PA pressure. Left Atrium: Left atrial size was normal in size. Right Atrium: Right atrial size was normal in size. Pericardium: There is no evidence of pericardial effusion. Mitral Valve: The mitral valve is normal in structure. No evidence of mitral valve regurgitation. No evidence of mitral  valve stenosis. Tricuspid Valve: The tricuspid valve is not well visualized. Tricuspid valve regurgitation is trivial. No evidence of tricuspid stenosis. Aortic Valve: The aortic valve is tricuspid. Aortic valve regurgitation is not visualized. No aortic stenosis is present. Aortic valve mean gradient measures 3.5 mmHg. Aortic valve peak gradient measures 7.5 mmHg. Aortic valve area, by VTI measures 2.44 cm. Pulmonic Valve: The pulmonic valve was not well visualized. Pulmonic valve regurgitation is not visualized. No evidence of pulmonic stenosis. Aorta: The aortic root is normal in size and structure. There is mild dilatation of the ascending aorta, measuring 37 mm. Venous: The inferior vena cava is normal in size with greater than 50% respiratory  variability, suggesting right atrial pressure of 3 mmHg. IAS/Shunts: No atrial level shunt detected by color flow Doppler.  LEFT VENTRICLE PLAX 2D LVIDd:         4.20 cm   Diastology LVIDs:         2.50 cm   LV e' medial:    7.29 cm/s LV PW:         1.20 cm   LV E/e' medial:  9.4 LV IVS:        1.20 cm   LV e' lateral:   9.03 cm/s LVOT diam:     2.00 cm   LV E/e' lateral: 7.6 LV SV:         66 LV SV Index:   34 LVOT Area:     3.14 cm  RIGHT VENTRICLE             IVC RV Basal diam:  3.00 cm     IVC diam: 1.60 cm RV S prime:     10.70 cm/s TAPSE (M-mode): 1.7 cm LEFT ATRIUM             Index        RIGHT ATRIUM           Index LA diam:        3.40 cm 1.74 cm/m   RA Area:     15.10 cm LA Vol (A2C):   43.3 ml 22.10 ml/m  RA Volume:   38.80 ml  19.80 ml/m LA Vol (A4C):   25.7 ml 13.12 ml/m LA Biplane Vol: 34.9 ml 17.81 ml/m  AORTIC VALVE AV Area (Vmax):    2.34 cm AV Area (Vmean):   2.37 cm AV Area (VTI):     2.44 cm AV Vmax:           137.09 cm/s AV Vmean:          86.687 cm/s AV VTI:            0.269 m AV Peak Grad:      7.5 mmHg AV Mean Grad:      3.5 mmHg LVOT Vmax:         102.00 cm/s LVOT Vmean:        65.500 cm/s LVOT VTI:          0.209 m LVOT/AV VTI ratio: 0.78  AORTA Ao Root diam: 3.30 cm Ao Asc diam:  3.70 cm MITRAL VALVE MV Area (PHT): 3.17 cm    SHUNTS MV Decel Time: 239 msec    Systemic VTI:  0.21 m MV E velocity: 68.30 cm/s  Systemic Diam: 2.00 cm MV A velocity: 77.80 cm/s MV E/A ratio:  0.88 Dorn Ross MD Electronically signed by Dorn Ross MD Signature Date/Time: 11/22/2023/4:43:24 PM    Final     Vitals:   11/23/23  0900 11/23/23 1000 11/23/23 1100 11/23/23 1200  BP: (!) 163/85 (!) 160/103 (!) 152/100   Pulse: 70 (!) 59 70   Resp: 16 15 17    Temp:    97.6 F (36.4 C)  TempSrc:    Oral  SpO2: 90% 93% 93%   Weight:         PHYSICAL EXAM General:  Alert, well-nourished, well-developed patient in no acute distress Psych:  Mood and affect appropriate for situation CV:  Regular rate and rhythm on monitor Respiratory:  Regular, unlabored respirations on room air GI: Abdomen soft and nontender   NEURO:  Mental Status: AA&Ox3, patient is able to give clear and coherent history Speech/Language: speech is without dysarthria or aphasia.  Naming, repetition, fluency, and comprehension intact.  Cranial Nerves:  II: PERRL. Visual fields full.  III, IV, VI: EOMI. Eyelids elevate symmetrically.  V: Sensation is intact to light touch and symmetrical to face.  VII: Face is symmetrical resting and smiling VIII: hearing intact to voice. IX, X: Palate elevates symmetrically. Phonation is normal.  KP:Dynloizm shrug 5/5. XII: tongue is midline without fasciculations. Motor: 5/5 strength in right arm and leg, left arm and leg with slight drift Tone: is normal and bulk is normal Sensation- Intact to light touch bilaterally. Extinction absent to light touch to DSS.   Coordination: Ataxia on left arm Gait- deferred  Most Recent NIH  1a Level of Conscious.:  1b LOC Questions:  1c LOC Commands:  2 Best Gaze:  3 Visual:  4 Facial Palsy:  5a Motor Arm - left: 1 5b Motor Arm - Right:  6a Motor Leg - Left: 1 6b Motor Leg - Right:  7 Limb Ataxia: 1 8 Sensory:  9 Best Language:  10 Dysarthria:  11 Extinct. and Inatten.:  TOTAL: 3   ASSESSMENT/PLAN  [name] is a 73 y.o. male with history of COPD, heart failure, presents with left-sided weakness that started abruptly   NIH on Admission 5  Intracerebral Hemorrhage and IVH  right thalamus, multifocal subarachnoid hemorrhage  Etiology: Likely hypertensive CT head Stable 3 cm hemorrhage in the right thalamus and intraventricular hemorrhage, right greater than left. 2. New multifocal subarachnoid hemorrhage bilaterally, most prominent over the left parietal lobe, right frontal and perculum, and in the occipital lobes bilaterally. CTA head & neck no LVO MRI ordered 2D Echo ordered LDL 47 HgbA1c  pending VTE prophylaxis -SCDs aspirin  81 mg daily prior to admission, now on No antithrombotic due to acute hemorrhage Therapy recommendations:  Pending Disposition: Pending  Hypertension HFp EF Home meds: None Started on Norvasc  5 mg, losartan  50 mg Beta-blocker as needed to maintain BP goal Blood Pressure Goal: SBP between 130-150 for 24 hours and then less than 160   Hyperlipidemia Home meds: None,  LDL 47 goal < 70   Acute on chronic respiratory failure with hypoxia On home O2  Dysphagia Patient has post-stroke dysphagia, SLP consulted    Diet   Diet Heart Room service appropriate? Yes with Assist; Fluid consistency: Thin   Advance diet as tolerated  Other Stroke Risk Factors Congestive heart failure   Other Active Problems Leukocytosis-WBC 12.5, afebrile Polycythemia-Hgb 19.4, HCT 56  Patient presented with right thalamic and intraventricular hemorrhage likely due to uncontrolled hypertension.  Continue strict blood pressure control with systolic goal  below 160.  Mobilize out of bed.  Physical Occupational Therapy consults.  Check MRI scan of the brain today..  Long discussion with patient and family at the  bedside and answered questions.  Discussed with critical care team and Dr. Debby neurosurgery.  Recommend transfer out of ICU to medical hospitalist service.  Mobilize out of bed and therapy and rehab consults.    I personally spent a total of 50 minutes in the care of the patient today including getting/reviewing separately obtained history, performing a medically appropriate exam/evaluation, counseling and educating, placing orders, referring and communicating with other health care professionals, documenting clinical information in the EHR, independently interpreting results, and coordinating care.            Eather Popp, MD Medical Director [facility] Stroke Center Pager: [phone] 11/23/2023 1:07 PM   To contact Stroke Continuity provider, please  refer to [email or web address]. After hours, contact General Neurology

## 2023-11-23 NOTE — Progress Notes (Signed)
 NAME:  Zachary Brown, MRN:  [id number], DOB:  Apr 25, 1950, LOS: 2 ADMISSION DATE:  11/21/2023, CONSULTATION DATE:  8/8 REFERRING MD:  Dr [facility] ED), CHIEF COMPLAINT:  ICH   History of Present Illness:  72 year old male with PMH as below who presented to [facility] ED 8/8 after being found on the floor by his daughter. He reports having possibly falling although he does not remember the events surrounding the incident, but feels as though he did not hit his head. He estimates being on the floor for approximately three hours prior to being found by his daughter. ED documentation reports the patient walking and left leg growing weak as well as Left arm numbness prior to fall. . Other areas on injury include the left forearm. Upon arrival to the ED he was noted to have left sided weakness and was immediatly taken for CT of the head. Found to have right sided basal ganglia IPH with intraventricular extension. He was started on nicardipine infusion for blood pressure control . Neurosurgery at [facility] was consulted and recommended transfer for neuro ICU admission. PCCM accepted the patient for transfer.   Pertinent  Medical History   has a past medical history of Chronic respiratory failure with hypoxia (HCC), Heart failure (HCC), Pneumonia (1956), and Snake bite (1980). CHF (Grade 1 DD on echo from 2017)   Significant Hospital Events: Including procedures, antibiotic start and stop dates in addition to other pertinent events   8/8 admit for IPH Head CT 09/22/2023 >> stable 3 cm right thalamic hemorrhage with IVH right greater than left, new multifocal subarachnoid hemorrhage bilaterally most prominent left parietal, right frontal and bilateral occipital lobes.  Scalp soft tissue swelling near the vertex without a fracture PFT 02/2016 >FEV175%, ratio 87, FVC 64%, no sign BD response, TLC 68%, DLCO 22%.  8/9 echocardiogram LVEF 60-65% with mild LVH and indeterminate diastolic parameters.  Mild to  moderate reduced RV function.  RV is moderately to severely enlarged  Interim History / Subjective:  Systolic blood pressure at goal, diastolic remains high Nasal cannula 4 L/min MRI brain pending   Objective    Blood pressure (!) 155/110, pulse 69, temperature 97.6 F (36.4 C), temperature source Axillary, resp. rate 16, weight 86.4 kg, SpO2 94%.        Intake/Output Summary (Last 24 hours) at 11/23/2023 0717 Last data filed at 11/23/2023 0600 Gross per 24 hour  Intake 75 ml  Output 900 ml  Net -825 ml   Filed Weights   11/22/23 0345 11/23/23 0403  Weight: 80.1 kg 86.4 kg    Examination: General: No distress on 4 L/min, laying in bed, comfortable HENT: Oropharynx clear, strong voice, no stridor or secretions Lungs: Few bilateral inspiratory crackles, no wheezing Cardiovascular: Regular, no murmur Abdomen: Soft and benign Extremities: No deformity Neuro: Awake and alert, appropriate, answers questions and follows commands.  Pupils are equal and reactive.  Tongue midline.  Overall strength is equal  Resolved problem list   Assessment and Plan    Intraparenchymal hemorrhage: right basal ganglia with extension into the right lateral and third ventricles. Concern for hypertensive bleed based on location, but did fall and he has scalp soft tissue injury.  Intraventricular hemorrhage Subarachnoid hemorrhage - Appreciate neurology and neurosurgery management - SBP goal 130-150 - Blood pressure control: Amlodipine  5, losartan  50.  Uptitrate if necessary - Labetalol  available if needed - Has not needed nicardipine - Neuroprotective measures and seizure precautions - MRI pending  Possible  ILD: started workup in pulm office 2017 but lost to follow up.  Acute on chronic respiratory failure with hypoxia -Has not had access to his home oxygen, concentrator has been broken.  Will need to try to reestablish - Once stabilized neurologically needs high-resolution CT chest to  evaluate his ILD - Wean O2 as able, former baseline 2 L/min although he has not used the oxygen for an extended time as it has not been available.  Hx HFpEF ? Syncope Secondary PAH based on echocardiogram -Careful with IV fluids - Suspect that PAH is due to noncompliance with oxygen over the last several years.  Further workup as an outpatient appropriate including screening for VTE, OSA if clinical suspicion, etc.  Elevated bilirubin -RUQ ultrasound not done yet.  Follow bilirubin and consider this hospitalization  DM - Unclear whether he was taking metformin  as per his medication list.  Will attempt to clarify - CBG, sliding scale insulin    Best Practice (right click and Reselect all SmartList Selections daily)   Diet/type: Regular consistency (see orders) DVT prophylaxis SCD Pressure ulcer(s): pressure ulcer assessment deferred  GI prophylaxis: N/A Lines: N/A Foley:  N/A Code Status:  full code Last date of multidisciplinary goals of care discussion [ ]   Labs   CBC: Recent Labs  Lab 11/22/23 0609  WBC 12.5*  HGB 19.4*  HCT 56.6*  MCV 92.2  PLT 141*    Basic Metabolic Panel: Recent Labs  Lab 11/22/23 0609  NA 137  K 4.2  CL 103  CO2 25  GLUCOSE 167*  BUN 14  CREATININE 1.02  CALCIUM 8.8*  MG 1.8  PHOS 3.9   GFR: CrCl cannot be calculated (Unknown ideal weight.). Recent Labs  Lab 11/22/23 0609  WBC 12.5*    Liver Function Tests: Recent Labs  Lab 11/22/23 0609  AST 30  ALT 24  ALKPHOS 56  BILITOT 2.4*  PROT 6.5  ALBUMIN 3.4*   No results for input(s): LIPASE, AMYLASE in the last 168 hours. No results for input(s): AMMONIA in the last 168 hours.  ABG    Component Value Date/Time   PHART 7.406 01/21/2016 2141   PCO2ART 90.4 (HH) 01/21/2016 2141   PO2ART 55.9 (L) 01/21/2016 2141   HCO3 55.6 (H) 01/21/2016 2141   O2SAT 88.0 01/21/2016 2141     Coagulation Profile: No results for input(s): INR, PROTIME in the last 168  hours.  Cardiac Enzymes: No results for input(s): CKTOTAL, CKMB, CKMBINDEX, TROPONINI in the last 168 hours.  HbA1C: Hgb A1c MFr Bld  Date/Time Value Ref Range Status  01/29/2016 02:43 PM 8.0 (H) 4.8 - 5.6 % Final    Comment:    (NOTE)         Pre-diabetes: 5.7 - 6.4         Diabetes: >6.4         Glycemic control for adults with diabetes: <7.0     CBG: Recent Labs  Lab 11/22/23 1108 11/22/23 1656 11/22/23 2002 11/22/23 2350 11/23/23 0346  GLUCAP 151* 187* 194* 132* 164*    Critical care time: N/A     Lamar Chris, MD, PhD 11/23/2023, 7:17 AM [facility] [phone] or if no answer before 7:00PM call [phone] For any issues after 7:00PM please call eLink [phone]

## 2023-11-23 NOTE — Progress Notes (Signed)
   11/23/23 1700  Vitals  BP Location Left Arm  BP Method Automatic  Patient Position (if appropriate) Orthostatic Vitals  Orthostatic Lying   BP- Lying 172/80 (167/104 supine again after sitting)  Orthostatic Sitting  BP- Sitting (!) 185/95    Labetalol  administered per order. B/P above while working with therapy.

## 2023-11-23 NOTE — Evaluation (Signed)
 Physical Therapy Evaluation Patient Details Name: Zachary Brown MRN: [id number] DOB: [birth year] Today's Date: 11/23/2023  History of Present Illness  Pt is a 73 y.o. male who presented 11/21/23 s/p fall and L-sided weakness. CT showed a hemorrhage in the R thalamus and intraventricular hemorrhage (R>L), SAH bilaterally, most prominent over the L parietal lobe, R frontal and perculum, and in the occipital lobes bilaterally. PMH: heart failure, snake bite 1980, COPD   Clinical Impression  Pt presents with condition above and deficits mentioned below, see PT Problem List. PTA, he was independent without DME, living alone in a mobile home with 5 STE. His family is currently trying to arrange 24/7 care once he returns home. The session was limited this date by his SBP being greater than 160 goal. Attempted to sit EOB to see whether the BP may dip down within range, but it did not, thus the pt was returned back to supine for safety. RN notified and obtaining BP meds at end of session. Due to his elevated BP this date, was unable to attempt OOB mobility. He required modA for bed mobility though. He displayed pushing syndrome tendencies as he pushed himself over to his weaker L side when sitting EOB, needing modA initially for balance and progressed to CGA once his R hand was on the end of bed rail. Currently, the pt demonstrates deficits in awareness, problem-solving, sequencing, attention, balance, activity tolerance, and L-sided strength and likely sensation (difficult to assess sensation due to pt's lethargy this date). He has had a drastic functional decline, has good family support, and is motivated to return to being independent and working in his garden and thus could greatly benefit from intensive inpatient rehab, > 3 hours/day. Will continue to follow acutely.     BP -  172/80 supine 185/95 sitting EOB thus returned pt to supine 167/104 supine end of session      If plan is discharge home,  recommend the following: Two people to help with walking and/or transfers;Two people to help with bathing/dressing/bathroom;Assistance with cooking/housework;Direct supervision/assist for medications management;Direct supervision/assist for financial management;Assist for transportation;Help with stairs or ramp for entrance;Supervision due to cognitive status   Can travel by private vehicle        Equipment Recommendations Other (comment) (TBD)  Recommendations for Other Services  Rehab consult;Speech consult    Functional Status Assessment Patient has had a recent decline in their functional status and demonstrates the ability to make significant improvements in function in a reasonable and predictable amount of time.     Precautions / Restrictions Precautions Precautions: Fall;Other (comment) Recall of Precautions/Restrictions: Impaired Precaution/Restrictions Comments: SBP < 160 goal; pushes to L Restrictions Weight Bearing Restrictions Per Provider Order: No      Mobility  Bed Mobility Overal bed mobility: Needs Assistance Bed Mobility: Supine to Sit, Sit to Supine     Supine to sit: Mod assist, HOB elevated, Used rails Sit to supine: Mod assist, HOB elevated   General bed mobility comments: Step-by-step cues provided for pt to bring legs off L EOB, needing assistance at L leg. Cues then provided to reach R hand to L rail to pull to scoot hips and ascend trunk, cuing pt to push through L UE to ascend trunk as well. ModA needed at trunk as pt maintained a L lateral lean. SBP remained > 160 when sitting EOB, thus returned pt to supine for safety. ModA needed at legs to return to supine.    Transfers  General transfer comment: deferred due to BP remaining elevated with SBP > 160 when sitting EOB, returned pt to supine instead with SBP improving and RN aware with plans to get pt some BP meds    Ambulation/Gait               General Gait  Details: deferred due to BP remaining elevated with SBP > 160 when sitting EOB, returned pt to supine instead with SBP improving and RN aware with plans to get pt some BP meds  Stairs            Wheelchair Mobility     Tilt Bed    Modified Rankin (Stroke Patients Only) Modified Rankin (Stroke Patients Only) Pre-Morbid Rankin Score: No symptoms Modified Rankin: Severe disability     Balance Overall balance assessment: Needs assistance Sitting-balance support: Single extremity supported, Bilateral upper extremity supported, Feet supported Sitting balance-Leahy Scale: Poor Sitting balance - Comments: Pt pushes himself to his L, needing visual cues to identify his lean and correct it. ModA initially but progressed to CGA once R hand had grab of the end of bed rail. Postural control: Left lateral lean     Standing balance comment: deferred due to BP remaining elevated with SBP > 160 when sitting EOB, returned pt to supine instead with SBP improving and RN aware with plans to get pt some BP meds                             Pertinent Vitals/Pain Pain Assessment Pain Assessment: Faces Faces Pain Scale: No hurt Pain Intervention(s): Monitored during session    Home Living Family/patient expects to be discharged to:: Private residence Living Arrangements: Alone Available Help at Discharge: Family;Available 24 hours/day (potentially 24/7, family is working on arranging it) Type of Home: Mobile home Home Access: Stairs to enter Entrance Stairs-Rails: Right Entrance Stairs-Number of Steps: 5   Home Layout: One level Home Equipment: None      Prior Function Prior Level of Function : Independent/Modified Independent;Driving             Mobility Comments: No AD ADLs Comments: Likes to work in his garden     Extremity/Trunk Assessment   Upper Extremity Assessment Upper Extremity Assessment: Defer to OT evaluation;Left hand dominant    Lower Extremity  Assessment Lower Extremity Assessment: LLE deficits/detail LLE Deficits / Details: unsure whether pt could detect touch distally or not, pt often falling asleep during this cue/test, but able to detect touch at knee accurately; Weakness noted on L compared to R (R grossly >/= 4+/5 MMT) with MMT of 3- knee extension and 2+ ankle dorsiflexion on L LLE Coordination: decreased gross motor    Cervical / Trunk Assessment Cervical / Trunk Assessment: Normal  Communication   Communication Communication: No apparent difficulties    Cognition Arousal: Lethargic Behavior During Therapy: WFL for tasks assessed/performed   PT - Cognitive impairments: Memory, Attention, Awareness, Sequencing, Problem solving, Safety/Judgement                       PT - Cognition Comments: Pt lethargic, often needing stimulation to remain awake when supine. Daughter reports pt has not slept much since admission. While pt is able to identify he is leaning to the L when prompted, he displays poor awareness and problem-solving to correct it. Needs simple step-by-step cues to sequence mobility Following commands: Impaired Following commands impaired: Follows one step commands  with increased time     Cueing Cueing Techniques: Verbal cues, Tactile cues, Visual cues, Gestural cues     General Comments General comments (skin integrity, edema, etc.): BP 172/80 supine, 185/95 sitting EOB thus returned pt to supine, 167/104 supine end of session - RN notified and obtaining BP meds for pt    Exercises     Assessment/Plan    PT Assessment Patient needs continued PT services  PT Problem List Decreased strength;Decreased activity tolerance;Decreased balance;Decreased mobility;Decreased coordination;Decreased cognition;Decreased knowledge of use of DME;Decreased safety awareness;Impaired sensation       PT Treatment Interventions DME instruction;Gait training;Stair training;Functional mobility training;Therapeutic  activities;Therapeutic exercise;Balance training;Neuromuscular re-education;Cognitive remediation;Patient/family education    PT Goals (Current goals can be found in the Care Plan section)  Acute Rehab PT Goals Patient Stated Goal: to return to being independent PT Goal Formulation: With patient/family Time For Goal Achievement: 12/07/23 Potential to Achieve Goals: Good    Frequency Min 3X/week     Co-evaluation               AM-PAC PT 6 Clicks Mobility  Outcome Measure Help needed turning from your back to your side while in a flat bed without using bedrails?: A Lot Help needed moving from lying on your back to sitting on the side of a flat bed without using bedrails?: A Lot Help needed moving to and from a bed to a chair (including a wheelchair)?: Total Help needed standing up from a chair using your arms (e.g., wheelchair or bedside chair)?: Total Help needed to walk in hospital room?: Total Help needed climbing 3-5 steps with a railing? : Total 6 Click Score: 8    End of Session Equipment Utilized During Treatment: Oxygen Activity Tolerance: Patient tolerated treatment well;Treatment limited secondary to medical complications (Comment) (limited by elevated BP outside goal SBP) Patient left: in bed;with call bell/phone within reach;with bed alarm set;with family/visitor present Nurse Communication: Mobility status;Other (comment) (BP) PT Visit Diagnosis: Muscle weakness (generalized) (M62.81);Difficulty in walking, not elsewhere classified (R26.2);Other symptoms and signs involving the nervous system (R29.898);Hemiplegia and hemiparesis Hemiplegia - Right/Left: Left Hemiplegia - dominant/non-dominant: Dominant Hemiplegia - caused by: Nontraumatic intracerebral hemorrhage    Time: 1743-1820 PT Time Calculation (min) (ACUTE ONLY): 37 min   Charges:   PT Evaluation $PT Eval Moderate Complexity: 1 Mod PT Treatments $Therapeutic Activity: 8-22 mins PT General  Charges $$ ACUTE PT VISIT: 1 Visit         Theo Ferretti, PT, DPT Acute Rehabilitation Services  Office: [phone]   Theo CHRISTELLA Ferretti 11/23/2023, 6:46 PM

## 2023-11-24 ENCOUNTER — Inpatient Hospital Stay (HOSPITAL_COMMUNITY)

## 2023-11-24 DIAGNOSIS — J9621 Acute and chronic respiratory failure with hypoxia: Secondary | ICD-10-CM

## 2023-11-24 DIAGNOSIS — S06310A Contusion and laceration of right cerebrum without loss of consciousness, initial encounter: Secondary | ICD-10-CM

## 2023-11-24 DIAGNOSIS — J439 Emphysema, unspecified: Secondary | ICD-10-CM

## 2023-11-24 DIAGNOSIS — R29703 NIHSS score 3: Secondary | ICD-10-CM | POA: Diagnosis not present

## 2023-11-24 DIAGNOSIS — D751 Secondary polycythemia: Secondary | ICD-10-CM

## 2023-11-24 DIAGNOSIS — I615 Nontraumatic intracerebral hemorrhage, intraventricular: Secondary | ICD-10-CM | POA: Diagnosis not present

## 2023-11-24 DIAGNOSIS — E1165 Type 2 diabetes mellitus with hyperglycemia: Secondary | ICD-10-CM

## 2023-11-24 DIAGNOSIS — I618 Other nontraumatic intracerebral hemorrhage: Secondary | ICD-10-CM | POA: Diagnosis not present

## 2023-11-24 DIAGNOSIS — I609 Nontraumatic subarachnoid hemorrhage, unspecified: Secondary | ICD-10-CM | POA: Diagnosis not present

## 2023-11-24 DIAGNOSIS — J849 Interstitial pulmonary disease, unspecified: Secondary | ICD-10-CM

## 2023-11-24 LAB — GLUCOSE, CAPILLARY
Glucose-Capillary: 149 mg/dL — ABNORMAL HIGH (ref 70–99)
Glucose-Capillary: 193 mg/dL — ABNORMAL HIGH (ref 70–99)
Glucose-Capillary: 194 mg/dL — ABNORMAL HIGH (ref 70–99)
Glucose-Capillary: 165 mg/dL — ABNORMAL HIGH (ref 70–99)
Glucose-Capillary: 151 mg/dL — ABNORMAL HIGH (ref 70–99)
Glucose-Capillary: 158 mg/dL — ABNORMAL HIGH (ref 70–99)

## 2023-11-24 LAB — MAGNESIUM: Magnesium: 2 mg/dL (ref 1.7–2.4)

## 2023-11-24 LAB — CBC
HCT: 58.8 % — ABNORMAL HIGH (ref 39.0–52.0)
Hemoglobin: 20.1 g/dL — ABNORMAL HIGH (ref 13.0–17.0)
MCH: 31.6 pg (ref 26.0–34.0)
MCHC: 34.2 g/dL (ref 30.0–36.0)
MCV: 92.5 fL (ref 80.0–100.0)
Platelets: 156 K/uL (ref 150–400)
RBC: 6.36 MIL/uL — ABNORMAL HIGH (ref 4.22–5.81)
RDW: 13.8 % (ref 11.5–15.5)
WBC: 13.1 K/uL — ABNORMAL HIGH (ref 4.0–10.5)
nRBC: 0 % (ref 0.0–0.2)

## 2023-11-24 LAB — BASIC METABOLIC PANEL WITH GFR
Anion gap: 9 (ref 5–15)
BUN: 20 mg/dL (ref 8–23)
CO2: 29 mmol/L (ref 22–32)
Calcium: 9 mg/dL (ref 8.9–10.3)
Chloride: 100 mmol/L (ref 98–111)
Creatinine, Ser: 0.91 mg/dL (ref 0.61–1.24)
GFR, Estimated: >60 mL/min (ref >60)
Glucose, Bld: 139 mg/dL — ABNORMAL HIGH (ref 70–99)
Potassium: 4.1 mmol/L (ref 3.5–5.1)
Sodium: 138 mmol/L (ref 135–145)

## 2023-11-24 LAB — PHOSPHORUS: Phosphorus: 3.1 mg/dL (ref 2.5–4.6)

## 2023-11-24 MED ORDER — INSULIN ASPART 100 UNIT/ML IJ SOLN: 0.0000 [IU] | Freq: Every day | INTRAMUSCULAR | Status: DC

## 2023-11-24 MED ORDER — INSULIN ASPART 100 UNIT/ML IJ SOLN
0.0000 [IU] | Freq: Three times a day (TID) | INTRAMUSCULAR | Status: DC
  Administered 2023-11-24 (×4): 2 [IU] via SUBCUTANEOUS

## 2023-11-24 MED ORDER — SODIUM CHLORIDE 3 % IV BOLUS
250.0000 mL | Freq: Once | INTRAVENOUS | Status: AC
  Administered 2023-11-25 (×2): 250 mL via INTRAVENOUS
  Filled 2023-11-24: qty 500

## 2023-11-24 MED ORDER — AMLODIPINE BESYLATE 5 MG PO TABS
5.0000 mg | ORAL_TABLET | Freq: Every day | ORAL | Status: DC
  Administered 2023-11-24 (×2): 5 mg via ORAL
  Filled 2023-11-24: qty 1

## 2023-11-24 MED ORDER — HYDRALAZINE HCL 20 MG/ML IJ SOLN
10.0000 mg | INTRAMUSCULAR | Status: DC | PRN
  Administered 2023-11-24 (×4): 10 mg via INTRAVENOUS
  Filled 2023-11-24 (×2): qty 1

## 2023-11-24 MED ORDER — MIDAZOLAM HCL 2 MG/2ML IJ SOLN
INTRAMUSCULAR | Status: AC
  Filled 2023-11-24: qty 2

## 2023-11-24 MED ORDER — LOSARTAN POTASSIUM 50 MG PO TABS
100.0000 mg | ORAL_TABLET | Freq: Every day | ORAL | Status: DC
  Administered 2023-11-24 (×2): 100 mg via ORAL
  Filled 2023-11-24: qty 2

## 2023-11-24 MED ORDER — HYDRALAZINE HCL 25 MG PO TABS
25.0000 mg | ORAL_TABLET | Freq: Three times a day (TID) | ORAL | Status: DC
  Administered 2023-11-24 (×2): 25 mg via ORAL
  Filled 2023-11-24 (×2): qty 1

## 2023-11-24 MED ORDER — INSULIN GLARGINE-YFGN 100 UNIT/ML ~~LOC~~ SOLN
6.0000 [IU] | Freq: Every day | SUBCUTANEOUS | Status: DC
  Administered 2023-11-24 – 2023-11-26 (×6): 6 [IU] via SUBCUTANEOUS
  Filled 2023-11-24 (×5): qty 0.06

## 2023-11-24 MED ORDER — SODIUM CHLORIDE 0.9 % IV SOLN: INTRAVENOUS | Status: AC

## 2023-11-24 MED ORDER — PROPOFOL 1000 MG/100ML IV EMUL
INTRAVENOUS | Status: AC
  Administered 2023-11-25 (×2): 10.4 mg
  Filled 2023-11-24: qty 100

## 2023-11-24 NOTE — Progress Notes (Addendum)
 Physical Therapy Treatment Patient Details Name: Zachary Brown MRN: [id number] DOB: Mar 11, 1951 Today's Date: 11/24/2023   History of Present Illness Pt is a 73 y.o. male who presented 11/21/23 s/p fall and L-sided weakness. CT showed a hemorrhage in the R thalamus and intraventricular hemorrhage (R>L), SAH bilaterally, most prominent over the L parietal lobe, R frontal and perculum, and in the occipital lobes bilaterally. PMH: heart failure, snake bite 1980, COPD    PT Comments  Unfortunately, continue to be limited in treatment progression due to elevated BP, above set parameters (RN aware). Pt able to perform supine bridging with LLE support and progress to edge of bed with mod-max assist +2. Worked on sitting balance with tactile/verbal cue of touching pt R shoulder to therapist shoulder; pt able to achieve, but not sustain, especially with any dual tasking or balance challenge. Pt with impaired proprioception, balance, cognition, and L sided weakness. Patient will benefit from intensive inpatient follow-up therapy, >3 hours/day.  Vitals: BP supine: 165/106 BP sitting: 176-179/100 BP return to supine: 167/102    If plan is discharge home, recommend the following: Two people to help with walking and/or transfers;Two people to help with bathing/dressing/bathroom;Assistance with cooking/housework;Direct supervision/assist for medications management;Direct supervision/assist for financial management;Assist for transportation;Help with stairs or ramp for entrance;Supervision due to cognitive status   Can travel by private vehicle        Equipment Recommendations  Other (comment) (TBD)    Recommendations for Other Services       Precautions / Restrictions Precautions Precautions: Fall;Other (comment) Recall of Precautions/Restrictions: Impaired Precaution/Restrictions Comments: SBP < 160 goal; pushes to L Restrictions Weight Bearing Restrictions Per Provider Order: No     Mobility   Bed Mobility Overal bed mobility: Needs Assistance Bed Mobility: Rolling, Sidelying to Sit, Sit to Sidelying Rolling: Mod assist Sidelying to sit: Max assist, +2 for physical assistance     Sit to sidelying: Max assist, +2 for physical assistance General bed mobility comments: step by step cueing for technique    Transfers                   General transfer comment: deferred due to BP remaining elevated with SBP > 160 when sitting EOB, returned pt to supine instead with SBP improving and RN aware with plans to get pt some BP meds    Ambulation/Gait                   Stairs             Wheelchair Mobility     Tilt Bed    Modified Rankin (Stroke Patients Only) Modified Rankin (Stroke Patients Only) Pre-Morbid Rankin Score: No symptoms Modified Rankin: Severe disability     Balance Overall balance assessment: Needs assistance Sitting-balance support: No upper extremity supported, Feet supported Sitting balance-Leahy Scale: Poor Sitting balance - Comments: pushing to L with R UE, with R hand in lap able to come to midline with visual/tactile cueing but looses balance statically towards L and posterior without cueing. Postural control: Posterior lean, Left lateral lean                                  Communication Communication Communication: No apparent difficulties  Cognition Arousal: Alert Behavior During Therapy: Flat affect   PT - Cognitive impairments: Memory, Attention, Awareness, Sequencing, Problem solving, Safety/Judgement  Following commands: Impaired Following commands impaired: Follows one step commands with increased time    Cueing Cueing Techniques: Verbal cues, Tactile cues, Visual cues, Gestural cues  Exercises Other Exercises Other Exercises: Supine bridging x 5    General Comments General comments (skin integrity, edema, etc.): BP 165/106 supine, 176-179/100s sitting EOB, and  returned to supine with BP 167/102. RN aware and obtaining BP meds for pt      Pertinent Vitals/Pain Pain Assessment Pain Assessment: Faces Faces Pain Scale: No hurt    Home Living Family/patient expects to be discharged to:: Private residence Living Arrangements: Alone Available Help at Discharge: Family;Available 24 hours/day (potentially 24/7, family is working on it) Type of Home: Mobile home Home Access: Stairs to enter Entrance Stairs-Rails: Right Entrance Stairs-Number of Steps: 5   Home Layout: One level Home Equipment: None      Prior Function            PT Goals (current goals can now be found in the care plan section) Acute Rehab PT Goals Potential to Achieve Goals: Good    Frequency    Min 3X/week      PT Plan      Co-evaluation PT/OT/SLP Co-Evaluation/Treatment: Yes Reason for Co-Treatment: Complexity of the patient's impairments (multi-system involvement);For patient/therapist safety;To address functional/ADL transfers PT goals addressed during session: Mobility/safety with mobility        AM-PAC PT 6 Clicks Mobility   Outcome Measure  Help needed turning from your back to your side while in a flat bed without using bedrails?: A Lot Help needed moving from lying on your back to sitting on the side of a flat bed without using bedrails?: A Lot Help needed moving to and from a bed to a chair (including a wheelchair)?: Total Help needed standing up from a chair using your arms (e.g., wheelchair or bedside chair)?: Total Help needed to walk in hospital room?: Total Help needed climbing 3-5 steps with a railing? : Total 6 Click Score: 8    End of Session Equipment Utilized During Treatment: Oxygen Activity Tolerance: Other (comment) (limited due to elevated BP) Patient left: in bed;with call bell/phone within reach;with bed alarm set;with family/visitor present Nurse Communication: Mobility status;Other (comment) (BP) PT Visit Diagnosis: Muscle  weakness (generalized) (M62.81);Difficulty in walking, not elsewhere classified (R26.2);Other symptoms and signs involving the nervous system (R29.898);Hemiplegia and hemiparesis Hemiplegia - Right/Left: Left Hemiplegia - dominant/non-dominant: Dominant Hemiplegia - caused by: Nontraumatic intracerebral hemorrhage     Time: 1027-1051 PT Time Calculation (min) (ACUTE ONLY): 24 min  Charges:    $Therapeutic Activity: 8-22 mins PT General Charges $$ ACUTE PT VISIT: 1 Visit                     Zachary Brown, PT, DPT Acute Rehabilitation Services Office [phone]    MCARTHUR IVINS 11/24/2023, 1:34 PM

## 2023-11-24 NOTE — PMR Pre-admission (Shared)
 PMR Admission Coordinator Pre-Admission Assessment  Patient: Zachary Brown is an 73 y.o., male MRN: [id number] DOB: Feb 09, 1951 Height:   Weight: 86.4 kg  Insurance Information HMO:     PPO:      PCP:      IPA:      80/20:      OTHER:  PRIMARY: Medicaid of [place]      Policy#: [id number]      Subscriber: patient CM Name: ***      Phone#: ***     Fax#: *** Pre-Cert#: ***      Employer: *** Benefits:  Phone #: ***     Name: *** Eff. Date: ***     Deduct: ***      Out of Pocket Max: ***      Life Max: *** CIR: ***      SNF: *** Outpatient: ***     Co-Pay: *** Home Health: ***      Co-Pay: *** DME: ***     Co-Pay: *** Providers: *** SECONDARY:       Policy#:      Phone#:   Financial Counselor:       Phone#:   The "Data Collection Information Summary" for patients in Inpatient Rehabilitation Facilities with attached "Privacy Act Statement-Health Care Records" was provided and verbally reviewed with: {CHL IP Patient Family WJ:[id number]}  Emergency Contact Information Contact Information     Name Relation Home Work Mobile   Allen,Jennifer Daughter   [phone]      Other Contacts     Name Relation Home Work Mobile   Allen,Shane Other   [phone]       Current Medical History  Patient Admitting Diagnosis: thalamic IPH, SAH History of Present Illness: Pt is a 73 year old male with  medical hx significant for: heart failure, chronic respiratory failure on 2L HOT, DM. Pt initially presented to [facility] ED on 8/8 d/t being found on the floor by his daughter. Injury to left forearm also noted. 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 Neurosurgery consulted and recommended transfer to [facility]. Pt transferred to [facility] on 11/21/23. Pt placed on 5L nasal cannula but has been weaned to room air. Neurology consulted.  Repeat CT on 8/9 showed stable 3cm right thalamic hemorrhage with IVH right greater than left, new multifocal subarachnoid  hemorrhage bilaterally most prominent left parietal, right frontal and bilateral occipital lobes. Scalp soft tissue swelling near vertex without a fracture. CTA negative for LVO. Echo showed EF 60-65%. MRI  on 8/10 showed right thalamic IPH, mild surrounding  vasogenic edema with 6mm of localized right to left shift at septum pellucidum, stable IVH and scattered SAH. Therapy evaluations completed and CIR recommended d/t pt's deficits in functional mobility.*** Complete NIHSS TOTAL: 4  Patient's medical record from [facility] has been reviewed by the rehabilitation admission coordinator and physician.  Past Medical History  Past Medical History:  Diagnosis Date   Chronic respiratory failure with hypoxia (HCC)    Heart failure (HCC)    Pneumonia 1956   severe   Snake bite 1980   copper heaed    Has the patient had major surgery during 100 days prior to admission? No  Family History   family history includes Asthma in his sister; Heart attack in his father.  Current Medications  Current Facility-Administered Medications:    0.9 %  sodium chloride  infusion, , Intravenous, Continuous, Gonfa, Taye T, MD   acetaminophen  (TYLENOL ) tablet  650 mg, 650 mg, Oral, Q4H PRN, Gretta Leita SQUIBB, DO, 650 mg at 11/23/23 2011   amLODipine  (NORVASC ) tablet 5 mg, 5 mg, Oral, Daily, Gonfa, Taye T, MD, 5 mg at 11/24/23 0849   Chlorhexidine  Gluconate Cloth 2 % PADS 6 each, 6 each, Topical, Q0600, Byrum, Robert S, MD, 6 each at 11/24/23 0536   docusate sodium  (COLACE) capsule 100 mg, 100 mg, Oral, BID PRN, Gretta Leita P, DO   famotidine  (PEPCID ) tablet 20 mg, 20 mg, Oral, BID, Gretta Leita SQUIBB, DO, 20 mg at 11/24/23 9150   hydrALAZINE  (APRESOLINE ) injection 10 mg, 10 mg, Intravenous, Q4H PRN, Gonfa, Taye T, MD, 10 mg at 11/24/23 1055   hydrALAZINE  (APRESOLINE ) tablet 25 mg, 25 mg, Oral, Q8H, Gonfa, Taye T, MD   insulin  aspart (novoLOG ) injection 0-5 Units, 0-5 Units, Subcutaneous, QHS, Gonfa, Taye T, MD    insulin  aspart (novoLOG ) injection 0-9 Units, 0-9 Units, Subcutaneous, TID WC, Gonfa, Taye T, MD   insulin  glargine-yfgn (SEMGLEE ) injection 6 Units, 6 Units, Subcutaneous, Daily, Gonfa, Taye T, MD   losartan  (COZAAR ) tablet 100 mg, 100 mg, Oral, Daily, Gonfa, Taye T, MD, 100 mg at 11/24/23 0849   ondansetron  (ZOFRAN ) injection 4 mg, 4 mg, Intravenous, Q6H PRN, Gretta Leita SQUIBB, DO, 4 mg at 11/23/23 1151   Oral care mouth rinse, 15 mL, Mouth Rinse, PRN, Byrum, Robert S, MD   polyethylene glycol (MIRALAX  / GLYCOLAX ) packet 17 g, 17 g, Oral, Daily PRN, Gretta Leita SQUIBB, DO  Patients Current Diet:  Diet Order             Diet Heart Room service appropriate? Yes with Assist; Fluid consistency: Thin  Diet effective now                   Precautions / Restrictions Precautions Precautions: Fall, Other (comment) Precaution/Restrictions Comments: SBP < 160 goal; pushes to L Restrictions Weight Bearing Restrictions Per Provider Order: No   Has the patient had 2 or more falls or a fall with injury in the past year? Yes  Prior Activity Level Community (5-7x/wk): gets out of house ~3-4 days/week  Prior Functional Level Self Care: Did the patient need help bathing, dressing, using the toilet or eating? Independent  Indoor Mobility: Did the patient need assistance with walking from room to room (with or without device)? Independent  Stairs: Did the patient need assistance with internal or external stairs (with or without device)? Independent  Functional Cognition: Did the patient need help planning regular tasks such as shopping or remembering to take medications? Independent  Patient Information    Patient's Response To:     Home Assistive Devices / Equipment Home Equipment: None  Prior Device Use: Indicate devices/aids used by the patient prior to current illness, exacerbation or injury? None of the above  Current Functional Level Cognition  Orientation Level: Oriented X4     Extremity Assessment (includes Sensation/Coordination)  Upper Extremity Assessment: Defer to OT evaluation, Left hand dominant  Lower Extremity Assessment: LLE deficits/detail LLE Deficits / Details: unsure whether pt could detect touch distally or not, pt often falling asleep during this cue/test, but able to detect touch at knee accurately; Weakness noted on L compared to R (R grossly >/= 4+/5 MMT) with MMT of 3- knee extension and 2+ ankle dorsiflexion on L LLE Coordination: decreased gross motor    ADLs       Mobility  Overal bed mobility: Needs Assistance Bed Mobility: Supine to Sit, Sit to Supine  Supine to sit: Mod assist, HOB elevated, Used rails Sit to supine: Mod assist, HOB elevated General bed mobility comments: Step-by-step cues provided for pt to bring legs off L EOB, needing assistance at L leg. Cues then provided to reach R hand to L rail to pull to scoot hips and ascend trunk, cuing pt to push through L UE to ascend trunk as well. ModA needed at trunk as pt maintained a L lateral lean. SBP remained > 160 when sitting EOB, thus returned pt to supine for safety. ModA needed at legs to return to supine.    Transfers  General transfer comment: deferred due to BP remaining elevated with SBP > 160 when sitting EOB, returned pt to supine instead with SBP improving and RN aware with plans to get pt some BP meds    Ambulation / Gait / Stairs / Wheelchair Mobility  Ambulation/Gait General Gait Details: deferred due to BP remaining elevated with SBP > 160 when sitting EOB, returned pt to supine instead with SBP improving and RN aware with plans to get pt some BP meds    Posture / Balance Dynamic Sitting Balance Sitting balance - Comments: Pt pushes himself to his L, needing visual cues to identify his lean and correct it. ModA initially but progressed to CGA once R hand had grab of the end of bed rail. Balance Overall balance assessment: Needs assistance Sitting-balance support:  Single extremity supported, Bilateral upper extremity supported, Feet supported Sitting balance-Leahy Scale: Poor Sitting balance - Comments: Pt pushes himself to his L, needing visual cues to identify his lean and correct it. ModA initially but progressed to CGA once R hand had grab of the end of bed rail. Postural control: Left lateral lean Standing balance comment: deferred due to BP remaining elevated with SBP > 160 when sitting EOB, returned pt to supine instead with SBP improving and RN aware with plans to get pt some BP meds    Special considerations/life events  Skin Abrasion: generalized/bilateral;Ecchymosis: scattered/bilateral and Diabetic management Semglee  6 units daily; Novolog  0-9 units 3x/meals; *** Continuous?IVPB: 0.9% sodium chloride  infusion, External Urinary Catheter   Previous Home Environment (from acute therapy documentation) Living Arrangements: Alone Available Help at Discharge: Family, Available 24 hours/day (potentially 24/7, family is working on arranging it) Type of Home: Mobile home Home Layout: One level Home Access: Stairs to enter Entrance Stairs-Rails: Right Entrance Stairs-Number of Steps: 5 Bathroom Shower/Tub: Engineer, manufacturing systems: Standard Bathroom Accessibility: Yes How Accessible: Accessible via walker Home Care Services: No  Discharge Living Setting Plans for Discharge Living Setting: Patient's home Type of Home at Discharge: Mobile home Discharge Home Layout: One level Discharge Home Access: Stairs to enter Entrance Stairs-Rails: Right Entrance Stairs-Number of Steps: 4 Discharge Bathroom Shower/Tub: Tub/shower unit Discharge Bathroom Toilet: Standard Discharge Bathroom Accessibility: Yes How Accessible: Accessible via walker  Social/Family/Support Systems Anticipated Caregiver: Delon Dawn (daughter) and other family Anticipated Caregiver's Contact Information: [phone] Caregiver Availability: 24/7 Discharge Plan  Discussed with Primary Caregiver: Yes Is Caregiver In Agreement with Plan?: Yes Does Caregiver/Family have Issues with Lodging/Transportation while Pt is in Rehab?: No  Goals Patient/Family Goal for Rehab: *** Expected length of stay: *** Pt/Family Agrees to Admission and willing to participate: Yes Program Orientation Provided & Reviewed with Pt/Caregiver Including Roles  & Responsibilities: Yes  Decrease burden of Care through IP rehab admission: NA  Possible need for SNF placement upon discharge: Not anticipated  Patient Condition: I have reviewed medical records from [facility], spoken  with CM, and patient, daughter, and family member. I met with patient at the bedside and discussed via phone for inpatient rehabilitation assessment.  Patient will benefit from ongoing PT, OT, and SLP, can actively participate in 3 hours of therapy a day 5 days of the week, and can make measurable gains during the admission.  Patient will also benefit from the coordinated team approach during an Inpatient Acute Rehabilitation admission.  The patient will receive intensive therapy as well as Rehabilitation physician, nursing, social worker, and care management interventions.  Due to bladder management, safety, skin/wound care, disease management, medication administration, pain management, and patient education the patient requires 24 hour a day rehabilitation nursing.  The patient is currently *** with mobility and basic ADLs.  Discharge setting and therapy post discharge at home with home health is anticipated.  Patient has agreed to participate in the Acute Inpatient Rehabilitation Program and will admit {Time; today/tomorrow:10263}.  Preadmission Screen Completed By:  Tinnie SHAUNNA Yvone Delayne, 11/24/2023 11:10 AM ______________________________________________________________________   Discussed status with Dr. PIERRETTE on *** at *** and received approval for admission today.  Admission Coordinator:  Tinnie SHAUNNA Yvone Delayne, CCC-SLP, time ***/Date ***   Assessment/Plan: Diagnosis: *** Does the need for close, 24 hr/day Medical supervision in concert with the patient's rehab needs make it unreasonable for this patient to be served in a less intensive setting? {yes_no_potentially:3041433} Co-Morbidities requiring supervision/potential complications: *** Due to {due un:6958565}, does the patient require 24 hr/day rehab nursing? {yes_no_potentially:3041433} Does the patient require coordinated care of a physician, rehab nurse, PT, OT, and SLP to address physical and functional deficits in the context of the above medical diagnosis(es)? {yes_no_potentially:3041433} Addressing deficits in the following areas: {deficits:3041436} Can the patient actively participate in an intensive therapy program of at least 3 hrs of therapy 5 days a week? {yes_no_potentially:3041433} The potential for patient to make measurable gains while on inpatient rehab is {potential:3041437} Anticipated functional outcomes upon discharge from inpatient rehab: {functional outcomes:304600100} PT, {functional outcomes:304600100} OT, {functional outcomes:304600100} SLP Estimated rehab length of stay to reach the above functional goals is: *** Anticipated discharge destination: {anticipated dc setting:21604} 10. Overall Rehab/Functional Prognosis: {potential:3041437}   MD Signature: ***

## 2023-11-24 NOTE — Progress Notes (Signed)

## 2023-11-24 NOTE — Plan of Care (Signed)

## 2023-11-24 NOTE — Progress Notes (Signed)
 Inpatient Rehab Admissions:  Inpatient Rehab Consult received.  I met with patient at the bedside for rehabilitation assessment and to discuss goals and expectations of an inpatient rehab admission.  Discussed average length of stay and discharge home after completion of CIR. He acknowledged understanding. Pt is interested in pursuing CIR. Pt gave permission to contact daughter Delon. Spoke with Delon and her husband on the phone. They also acknowledged understanding. They are supportive of pt pursuing CIR. They confirmed that family will be able to provide support for pt after discharge. Will continue to follow.  Signed: Tinnie Yvone Cohens, MS, CCC-SLP Admissions Coordinator [phone]

## 2023-11-24 NOTE — TOC CM/SW Note (Signed)
 Transition of Care [facility]) - Inpatient Brief Assessment   Patient Details  Name: Jaasiel Hollyfield MRN: [id number] Date of Birth: 08-20-1950  Transition of Care [facility]) CM/SW Contact:    Andrez JULIANNA George, RN Phone Number: 11/24/2023, 1:30 PM   Clinical Narrative:  CIR working up for rehab admission. IP Care management following.  Transition of Care Asessment: Insurance and Status: Insurance coverage has been reviewed Patient has primary care physician: Yes Home environment has been reviewed: home alone   Prior/Current Home Services: No current home services Social Drivers of Health Review: SDOH reviewed no interventions necessary Readmission risk has been reviewed: Yes Transition of care needs: transition of care needs identified, TOC will continue to follow

## 2023-11-24 NOTE — Progress Notes (Signed)
 PROGRESS NOTE  Zachary Brown FMW:[id number] DOB: 06-24-1950   PCP: Caleen Dirks, MD  Patient is from: Home  DOA: 11/21/2023 LOS: 3  Chief complaints Chief Complaint  Patient presents with   Cerebrovascular Accident     Brief Narrative / Interim history: 73 y/o gentleman with a history of ILD/asbestosis, chronic hypoxic RF on 2L HOT (machine broke), emphysema, DM, who presented to [facility] with a fall in his house and LOC.  BP elevated to 213/106.  He reported left-sided weakness.  Basic labs including CMP and CBC without significant finding.  INR 1.1.CT head showed right thalamic IPH with right intraventricular extension and to mid extent the third ventricle.  Patient was started on nicardipine drip.  After discussion with neurosurgery, he was transferred to the ICU at [facility].  Repeat CT head on 6/9 with a stable 3 cm right thalamic hemorrhage with IVH, right> left, new multifocal SAH bilaterally most prominent in left parietal, right frontal and bilateral occipital lobes and scalp soft tissue swelling in the vertex without fracture.  MRI brain on 8/10 with 4 x 2.4 x 3.8 cm right thalamic IPH, mild surrounding vasogenic edema with 6 mm of localized right to left shift at the septum pellucidum, stable IVH and scattered SAH.  TTE with LVEF of 60 to 65%, indeterminate DD and mild to moderately reduced RV function.  Patient came off the Cardene drip, and transferred to hospitalist service on 8/11.  Subjective: Seen and examined earlier this morning.  No major events overnight or this morning.  No complaints.  He is awake and alert and oriented x 4 except place.  Objective: Vitals:   11/24/23 0925 11/24/23 0930 11/24/23 0935 11/24/23 0940  BP: (!) 174/111 (!) 179/97 (!) 161/107   Pulse:      Resp: 15 14 15 14   Temp:      TempSrc:      SpO2:      Weight:        Examination:  GENERAL: No apparent distress.  Nontoxic. HEENT: MMM.  Vision and hearing grossly intact.  NECK:  Supple.  No apparent JVD.  RESP:  No IWOB.  Fair aeration bilaterally. CVS:  RRR. Heart sounds normal.  ABD/GI/GU: BS+. Abd soft, NTND.  MSK/EXT:  Moves extremities. No apparent deformity. No edema.  SKIN: no apparent skin lesion or wound NEURO: AA.  Oriented to self, date, month and year but not place.  Follows commands.  Seems to have left pronator drift.  Motor 4/5 in LUE, and 5/5 elsewhere. PSYCH: Calm. Normal affect.   Consultants:  Neurosurgery Critical care  Procedures: None  Microbiology summarized: None  Assessment and plan: Fall at home-found down.  Reportedly down for about 3 hours but not certain. Right thalamic intraparenchymal hemorrhage: Due to fall Intraventricular hemorrhage: Due to fall Multifocal subarachnoid hemorrhage: Due to fall -Found down on the floor for about 3 hours. -Initial CT head at [facility], repeat CT head and MRI brain as above. -TTE with mildly to moderately reduced RVF - Off Cardene drip.  BP not at goal.  Losartan  increased from 50 to 100 mg -Add hydralazine  25 mg every 8 hours -Continue amlodipine  5 mg daily.  May consider increasing -Change as needed IV labetalol  to IV hydralazine  given bradycardia - PT/OT-recommended CIR.   Possible ILD/asbestosis: started workup in pulm office 2017 but lost to follow up.  Acute on chronic respiratory failure with hypoxia: Reportedly on 2 L but much improved. -PFT 02/2016 >FEV175%, ratio 87, FVC  64%, no sign BD response, TLC 68%, DLCO 22%  -Denies smoking cigarettes. -HRCT chest ordered by pulmonology -Continue home oxygen.    Hx HFpEF/possible syncope/secondary PAH based on TTE -Monitor fluid status  Secondary polycythemia: Hgb 20.1 with HCT of 58.8.  Likely due to underlying respiratory failure/hypoxemia.  Erythropoietin  level normal. -IV NS at 75 cc an hour -Recheck in the morning   Elevated bilirubin: Mild.  RUQ US  without significant finding. - Recheck LFT in the morning    DM/hyperglycemia?  Does not seem to be on medication. -SSI sensitive-changed to ACHS -Semglee  6 units daily -Follow hemoglobin A1c  Leukocytosis: No clear source of infection.  Due to IPH?  Hemoconcentration? -IV fluid as above -Continue monitoring    Body mass index is 28.13 kg/m.           DVT prophylaxis:  SCDs Start: 11/21/23 2124  Code Status: Full code Family Communication: None at bedside Level of care: Telemetry Medical Status is: Inpatient Remains inpatient appropriate because: Intracranial hemorrhage, uncontrolled hypertension   Final disposition: CIR   55 minutes with more than 50% spent in reviewing records, counseling patient/family and coordinating care.   Sch Meds:  Scheduled Meds:  amLODipine   5 mg Oral Daily   Chlorhexidine  Gluconate Cloth  6 each Topical Q0600   famotidine   20 mg Oral BID   hydrALAZINE   25 mg Oral Q8H   insulin  aspart  0-5 Units Subcutaneous QHS   insulin  aspart  0-9 Units Subcutaneous TID WC   insulin  glargine-yfgn  6 Units Subcutaneous Daily   losartan   100 mg Oral Daily   Continuous Infusions:  sodium chloride      PRN Meds:.acetaminophen , docusate sodium , hydrALAZINE , ondansetron  (ZOFRAN ) IV, mouth rinse, polyethylene glycol  Antimicrobials: Anti-infectives (From admission, onward)    None        I have personally reviewed the following labs and images: CBC: Recent Labs  Lab 11/22/23 0609 11/24/23 0418  WBC 12.5* 13.1*  HGB 19.4* 20.1*  HCT 56.6* 58.8*  MCV 92.2 92.5  PLT 141* 156   BMP &GFR Recent Labs  Lab 11/22/23 0609 11/24/23 0418  NA 137 138  K 4.2 4.1  CL 103 100  CO2 25 29  GLUCOSE 167* 139*  BUN 14 20  CREATININE 1.02 0.91  CALCIUM 8.8* 9.0  MG 1.8 2.0  PHOS 3.9 3.1   CrCl cannot be calculated (Unknown ideal weight.). Liver & Pancreas: Recent Labs  Lab 11/22/23 0609  AST 30  ALT 24  ALKPHOS 56  BILITOT 2.4*  PROT 6.5  ALBUMIN 3.4*   No results for input(s): LIPASE,  AMYLASE in the last 168 hours. No results for input(s): AMMONIA in the last 168 hours. Diabetic: No results for input(s): HGBA1C in the last 72 hours. Recent Labs  Lab 11/23/23 1152 11/23/23 1932 11/23/23 2358 11/24/23 0413 11/24/23 0835  GLUCAP 169* 154* 165* 149* 193*   Cardiac Enzymes: No results for input(s): CKTOTAL, CKMB, CKMBINDEX, TROPONINI in the last 168 hours. No results for input(s): PROBNP in the last 8760 hours. Coagulation Profile: No results for input(s): INR, PROTIME in the last 168 hours. Thyroid Function Tests: No results for input(s): TSH, T4TOTAL, FREET4, T3FREE, THYROIDAB in the last 72 hours. Lipid Profile: Recent Labs    11/22/23 1156  CHOL 81  HDL 32*  LDLCALC 47  TRIG 11  CHOLHDL 2.5   Anemia Panel: No results for input(s): VITAMINB12, FOLATE, FERRITIN, TIBC, IRON, RETICCTPCT in the last 72 hours. Urine analysis:  Component Value Date/Time   COLORURINE YELLOW 11/22/2023 1451   APPEARANCEUR CLEAR 11/22/2023 1451   LABSPEC >1.046 (H) 11/22/2023 1451   PHURINE 5.0 11/22/2023 1451   GLUCOSEU NEGATIVE 11/22/2023 1451   HGBUR NEGATIVE 11/22/2023 1451   BILIRUBINUR NEGATIVE 11/22/2023 1451   KETONESUR 5 (A) 11/22/2023 1451   PROTEINUR 30 (A) 11/22/2023 1451   NITRITE NEGATIVE 11/22/2023 1451   LEUKOCYTESUR NEGATIVE 11/22/2023 1451   Sepsis Labs: Invalid input(s): PROCALCITONIN, LACTICIDVEN  Microbiology: Recent Results (from the past 240 hours)  MRSA Next Gen by PCR, Nasal     Status: None   Collection Time: 11/21/23  8:59 PM   Specimen: Nasal Mucosa; Nasal Swab  Result Value Ref Range Status   MRSA by PCR Next Gen NOT DETECTED NOT DETECTED Final    Comment: (NOTE) The GeneXpert MRSA Assay (FDA approved for NASAL specimens only), is one component of a comprehensive MRSA colonization surveillance program. It is not intended to diagnose MRSA infection nor to guide or monitor treatment for  MRSA infections. Test performance is not FDA approved in patients less than 65 years old. Performed at [facility], [address] [place], [place] [address]     Radiology Studies: MR BRAIN WO CONTRAST Result Date: 11/23/2023 CLINICAL DATA:  Follow-up examination for hemorrhagic stroke. EXAM: MRI HEAD WITHOUT CONTRAST TECHNIQUE: Multiplanar, multiecho pulse sequences of the brain and surrounding structures were obtained without intravenous contrast. COMPARISON:  Comparison made with CTs from 11/22/2023 FINDINGS: Brain: Examination severely degraded by motion artifact, limiting assessment. Cerebral volume within normal limits for age. Patchy T2/FLAIR hyperintensity involving the periventricular and deep white matter, consistent with chronic small vessel ischemic disease, moderate to advanced in nature. Previously identified intraparenchymal hemorrhage centered at the superior right thalamus again seen. Hemorrhage measures slightly increased in size now measuring 4.0 x 2.4 x 3.8 cm (estimated volume 18 mL, previously 12.5 mL on head CT from 11/22/2023). Mild surrounding vasogenic edema, slightly progressed. 6 mm of localized right-to-left shift at the septum pellucidum, also progressed. No visible lesion seen underlying the acute hemorrhage. Intraventricular extension again seen with blood in the lateral and third ventricles, similar. Stable ventricular size and morphology without progressive hydrocephalus. No convincing transependymal CSF. Scattered subarachnoid hemorrhage involving the bilateral cerebral hemispheres again seen, grossly similar. No other new intracranial hemorrhage. No other acute or subacute infarct. Few additional chronic parenchymal hemorrhages noted at the left frontal lobe and left thalamic capsular region (series 12, images 19, 47). No visible mass lesion. No extra-axial fluid collection. Pituitary gland and suprasellar region grossly within normal limits. Vascular: Major  intracranial vascular flow voids are grossly maintained at the skull base. Skull and upper cervical spine: Craniocervical junction within normal limits. Bone marrow signal intensity normal. No scalp soft tissue abnormality. Sinuses/Orbits: Globes orbital soft tissues grossly within normal limits. Paranasal sinuses are largely clear. No significant mastoid effusion. Other: None. IMPRESSION: 1. Motion degraded exam. 2. 4.0 x 2.4 x 3.8 cm intraparenchymal hemorrhage centered at the superior right thalamus, slightly increased in size from previous (estimated volume 18 mL, previously 12.5 mL). Mild surrounding vasogenic edema with 6 mm of localized right-to-left shift at the septum pellucidum, mildly progressed. No visible lesion seen underlying the acute hemorrhage. 3. Intraventricular extension with blood in the lateral and third ventricles, similar. Stable ventricular size and morphology without progressive hydrocephalus. 4. Scattered subarachnoid hemorrhage involving the bilateral cerebral hemispheres, grossly similar. 5. Underlying moderate to advanced chronic microvascular ischemic disease. Electronically Signed   By:  Morene Hoard M.D.   On: 11/23/2023 18:38      Lavaun Greenfield T. Asiel Chrostowski [facility]  If 7PM-7AM, please contact night-coverage [email or web address] 11/24/2023, 9:56 AM

## 2023-11-24 NOTE — Progress Notes (Signed)
 STROKE TEAM PROGRESS NOTE   INTERIM HISTORY/SUBJECTIVE  No family at the bedside.  Patient is awake alert sitting up in bed. He is doing well he has no complaints.  MRI shows stable appearance of the 4 x 2.4 x 3.8 cm parenchymal hemorrhage in the right superior thalamus slightly increased from previous and mild surrounding vasogenic edema.  Slight intraventricular extension with stable ventricular size and no hydrocephalus.  Blood pressure adequately controlled.  CBC    Component Value Date/Time   WBC 13.1 (H) 11/24/2023 0418   RBC 6.36 (H) 11/24/2023 0418   HGB 20.1 (H) 11/24/2023 0418   HCT 58.8 (H) 11/24/2023 0418   PLT 156 11/24/2023 0418   MCV 92.5 11/24/2023 0418   MCH 31.6 11/24/2023 0418   MCHC 34.2 11/24/2023 0418   RDW 13.8 11/24/2023 0418   LYMPHSABS 0.5 (L) 01/22/2016 0547   MONOABS 0.2 01/22/2016 0547   EOSABS 0.0 01/22/2016 0547   BASOSABS 0.0 01/22/2016 0547    BMET    Component Value Date/Time   NA 138 11/24/2023 0418   K 4.1 11/24/2023 0418   CL 100 11/24/2023 0418   CO2 29 11/24/2023 0418   GLUCOSE 139 (H) 11/24/2023 0418   BUN 20 11/24/2023 0418   CREATININE 0.91 11/24/2023 0418   CALCIUM 9.0 11/24/2023 0418   GFRNONAA >60 11/24/2023 0418    IMAGING past 24 hours No results found.   Vitals:   11/24/23 1210 11/24/23 1215 11/24/23 1220 11/24/23 1230  BP:  (!) 156/87  (!) 153/80  Pulse:      Resp: 19 17 17 17   Temp:      TempSrc:      SpO2:      Weight:         PHYSICAL EXAM General:  Alert, well-nourished, well-developed patient in no acute distress Psych:  Mood and affect appropriate for situation CV: Regular rate and rhythm on monitor Respiratory:  Regular, unlabored respirations on room air GI: Abdomen soft and nontender   NEURO:  Mental Status: AA&Ox3, patient is able to give clear and coherent history Speech/Language: speech is without dysarthria or aphasia.  Naming, repetition, fluency, and comprehension intact.  Cranial  Nerves:  II: PERRL. Visual fields full.  III, IV, VI: EOMI. Eyelids elevate symmetrically.  V: Sensation is intact to light touch and symmetrical to face.  VII: Face is symmetrical resting and smiling VIII: hearing intact to voice. IX, X: Palate elevates symmetrically. Phonation is normal.  KP:Dynloizm shrug 5/5. XII: tongue is midline without fasciculations. Motor: 5/5 strength in right arm and leg, left arm and leg with slight drift Tone: is normal and bulk is normal Sensation- Intact to light touch bilaterally. Extinction absent to light touch to DSS.   Coordination: Ataxia on left arm Gait- deferred  Most Recent NIH  1a Level of Conscious.:  1b LOC Questions:  1c LOC Commands:  2 Best Gaze:  3 Visual:  4 Facial Palsy:  5a Motor Arm - left: 1 5b Motor Arm - Right:  6a Motor Leg - Left: 1 6b Motor Leg - Right:  7 Limb Ataxia: 1 8 Sensory:  9 Best Language:  10 Dysarthria:  11 Extinct. and Inatten.:  TOTAL: 3   ASSESSMENT/PLAN  [name] is a 73 y.o. male with history of COPD, heart failure, presents with left-sided weakness that started abruptly   NIH on Admission 5  Intracerebral Hemorrhage and IVH  right thalamus, multifocal subarachnoid hemorrhage  Etiology: Likely hypertensive CT  head Stable 3 cm hemorrhage in the right thalamus and intraventricular hemorrhage, right greater than left. 2. New multifocal subarachnoid hemorrhage bilaterally, most prominent over the left parietal lobe, right frontal and perculum, and in the occipital lobes bilaterally. CTA head & neck no LVO MRI ordered 2D Echo ejection fraction 60 to 65%.  Left atrial size normal. LDL 47 HgbA1c pending VTE prophylaxis -SCDs aspirin  81 mg daily prior to admission, now on No antithrombotic due to acute hemorrhage Therapy recommendations:  Pending Disposition: Pending  Hypertension HFp EF Home meds: None Started on Norvasc  5 mg, losartan  50 mg Beta-blocker as needed to maintain BP  goal Blood Pressure Goal: SBP between 130-150 for 24 hours and then less than 160   Hyperlipidemia Home meds: None,  LDL 47 goal < 70   Acute on chronic respiratory failure with hypoxia On home O2  Dysphagia Patient has post-stroke dysphagia, SLP consulted    Diet   Diet Heart Room service appropriate? Yes with Assist; Fluid consistency: Thin   Advance diet as tolerated  Other Stroke Risk Factors Congestive heart failure   Other Active Problems Leukocytosis-WBC 12.5, afebrile Polycythemia-Hgb 19.4, HCT 56  Patient presented with right thalamic and intraventricular hemorrhage likely due to uncontrolled hypertension.  Continue strict blood pressure control with systolic goal  below 160.  Continue ongoing therapies.     Mobilize out of bed and therapy and rehab consults.  Transfer to rehab when bed available.  Stroke team will sign off.  Kindly call for questions.  Follow-up as an outpatient stroke clinic with nurse practitioner in 2 months    I personally spent a total of35 minutes in the care of the patient today including getting/reviewing separately obtained history, performing a medically appropriate exam/evaluation, counseling and educating, placing orders, referring and communicating with other health care professionals, documenting clinical information in the EHR, independently interpreting results, and coordinating care.            Eather Popp, MD Medical Director [facility] Pager: [phone] 11/24/2023 2:25 PM   To contact Stroke Continuity provider, please refer to [email or web address]. After hours, contact General Neurology

## 2023-11-24 NOTE — Evaluation (Addendum)
 Occupational Therapy Evaluation Patient Details Name: Zachary Brown MRN: [id number] DOB: June 08, 1950 Today's Date: 11/24/2023   History of Present Illness   Pt is a 73 y.o. male who presented 11/21/23 s/p fall and L-sided weakness. CT showed a hemorrhage in the R thalamus and intraventricular hemorrhage (R>L), SAH bilaterally, most prominent over the L parietal lobe, R frontal and perculum, and in the occipital lobes bilaterally. PMH: heart failure, snake bite 1980, COPD     Clinical Impressions PTA patient independent and driving. Admitted for above and presents with problem list below.  Pt currently requires mod-max assist +2 for bed mobility, min-mod assist sitting EOB and min to total assist +2 for ADLs at EOB/supine.  Limited by elevated BP, which did not decrease with upright position- see below for details. RN aware of BP.  Pt limited by cognition, sequencing, problem solving, awareness, L sided weakness and decreased coordination, and L inattention.  Further visual and cognitive assessment recommended. Based on performance today, believe pt will best benefit from continued OT services acutely and after dc at an inpatient setting with >3hrs/day to optimize independence, safety and return to PLOF with ADLs and mobility.   BP 165/106 supine 176-179/100s sitting EOB and returned to supine BP 167/102 supine  RN aware     If plan is discharge home, recommend the following:   Two people to help with walking and/or transfers;A lot of help with bathing/dressing/bathroom;Assistance with cooking/housework;Direct supervision/assist for medications management;Direct supervision/assist for financial management;Assist for transportation;Help with stairs or ramp for entrance     Functional Status Assessment   Patient has had a recent decline in their functional status and demonstrates the ability to make significant improvements in function in a reasonable and predictable amount of time.      Equipment Recommendations   Other (comment) (defer)     Recommendations for Other Services   Rehab consult     Precautions/Restrictions   Precautions Precautions: Fall;Other (comment) Recall of Precautions/Restrictions: Impaired Precaution/Restrictions Comments: SBP < 160 goal; pushes to L Restrictions Weight Bearing Restrictions Per Provider Order: No     Mobility Bed Mobility Overal bed mobility: Needs Assistance Bed Mobility: Rolling, Sidelying to Sit, Sit to Sidelying Rolling: Mod assist Sidelying to sit: Max assist, +2 for physical assistance     Sit to sidelying: Max assist, +2 for physical assistance General bed mobility comments: step by step cueing for technique    Transfers                   General transfer comment: deferred due to BP remaining elevated with SBP > 160 when sitting EOB, returned pt to supine instead with SBP improving and RN aware with plans to get pt some BP meds      Balance Overall balance assessment: Needs assistance Sitting-balance support: No upper extremity supported, Feet supported Sitting balance-Leahy Scale: Poor Sitting balance - Comments: pushing to L with R UE, with R hand in lap able to come to midline with visual/tactile cueing but looses balance statically towards L and posterior without cueing. Postural control: Posterior lean, Left lateral lean                                 ADL either performed or assessed with clinical judgement   ADL Overall ADL's : Needs assistance/impaired     Grooming: Moderate assistance;Sitting           Upper Body Dressing :  Maximal assistance;Sitting   Lower Body Dressing: Total assistance;+2 for physical assistance;Sitting/lateral leans;Bed level     Toilet Transfer Details (indicate cue type and reason): deferred d/t elevated bp         Functional mobility during ADLs: Maximal assistance;+2 for physical assistance       Vision Baseline  Vision/History: 0 No visual deficits Ability to See in Adequate Light: 0 Adequate Patient Visual Report: No change from baseline Vision Assessment?: Yes;Vision impaired- to be further tested in functional context Eye Alignment: Within Functional Limits Ocular Range of Motion: Within Functional Limits Visual Fields: No apparent deficits Additional Comments: pt with decreased attention to L side, unable to locate item (applesauce) on R side, does loose attention to task and requires cueing to keep head still but does locate correct # of fingers in all quadrants.  further assessment required     Perception Perception: Impaired Preception Impairment Details: Inattention/Neglect Perception-Other Comments: L inattention?   Praxis Praxis: Impaired Praxis Impairment Details: Motor planning     Pertinent Vitals/Pain Pain Assessment Pain Assessment: Faces Faces Pain Scale: No hurt Pain Intervention(s): Monitored during session     Extremity/Trunk Assessment Upper Extremity Assessment Upper Extremity Assessment: LUE deficits/detail;Right hand dominant LUE Deficits / Details: gorssly weak, ataxic LUE Sensation: decreased light touch;decreased proprioception LUE Coordination: decreased fine motor;decreased gross motor   Lower Extremity Assessment Lower Extremity Assessment: Defer to PT evaluation   Cervical / Trunk Assessment Cervical / Trunk Assessment: Normal   Communication Communication Communication: No apparent difficulties   Cognition Arousal: Alert Behavior During Therapy: Flat affect Cognition: Cognition impaired     Awareness: Intellectual awareness impaired Memory impairment (select all impairments): Working [employer] fast streamer, Short-term memory Attention impairment (select first level of impairment): Selective attention Executive functioning impairment (select all impairments): Problem solving, Sequencing, Initiation OT - Cognition Comments: pt oriented, some increaed time to  follow 1 step command.  Decreased attention to task.                 Following commands: Impaired Following commands impaired: Follows one step commands with increased time     Cueing  General Comments   Cueing Techniques: Verbal cues;Tactile cues;Visual cues;Gestural cues  BP 165/106 supine, 176-179/100s sitting EOB, and returned to supine with BP 167/102. RN aware and obtaining BP meds for pt   Exercises     Shoulder Instructions      Home Living Family/patient expects to be discharged to:: Private residence Living Arrangements: Alone Available Help at Discharge: Family;Available 24 hours/day (potentially 24/7, family is working on it) Type of Home: Mobile home Home Access: Stairs to enter Secretary/administrator of Steps: 5 Entrance Stairs-Rails: Right Home Layout: One level     Bathroom Shower/Tub: Chief Strategy Officer: Standard Bathroom Accessibility: Yes How Accessible: Accessible via walker Home Equipment: None          Prior Functioning/Environment Prior Level of Function : Independent/Modified Independent;Driving             Mobility Comments: No AD ADLs Comments: Likes to work in his garden    OT Problem List: Decreased strength;Decreased activity tolerance;Impaired balance (sitting and/or standing);Impaired UE functional use;Impaired sensation;Cardiopulmonary status limiting activity;Decreased knowledge of precautions;Decreased knowledge of use of DME or AE;Decreased safety awareness;Decreased cognition;Decreased coordination;Impaired vision/perception   OT Treatment/Interventions: Self-care/ADL training;Neuromuscular education;DME and/or AE instruction;Therapeutic activities;Cognitive remediation/compensation;Visual/perceptual remediation/compensation;Patient/family education;Balance training      OT Goals(Current goals can be found in the care plan section)   Acute Rehab OT Goals Patient  Stated Goal: get better OT Goal  Formulation: With patient Time For Goal Achievement: 12/08/23 Potential to Achieve Goals: Good   OT Frequency:  Min 2X/week    Co-evaluation              AM-PAC OT 6 Clicks Daily Activity     Outcome Measure Help from another person eating meals?: A Little Help from another person taking care of personal grooming?: A Lot Help from another person toileting, which includes using toliet, bedpan, or urinal?: A Lot Help from another person bathing (including washing, rinsing, drying)?: A Lot Help from another person to put on and taking off regular upper body clothing?: A Lot Help from another person to put on and taking off regular lower body clothing?: Total 6 Click Score: 12   End of Session Equipment Utilized During Treatment: Oxygen (4L) Nurse Communication: Mobility status;Other (comment) (BP)  Activity Tolerance: Other (comment) (limited by hypertension) Patient left: in bed;with call bell/phone within reach;with bed alarm set;with nursing/sitter in room  OT Visit Diagnosis: Other abnormalities of gait and mobility (R26.89);Muscle weakness (generalized) (M62.81);Hemiplegia and hemiparesis;Other symptoms and signs involving cognitive function Hemiplegia - Right/Left: Left Hemiplegia - dominant/non-dominant: Non-Dominant Hemiplegia - caused by: Nontraumatic intracerebral hemorrhage                Time: 1027-1049 OT Time Calculation (min): 22 min Charges:  OT General Charges $OT Visit: 1 Visit OT Evaluation $OT Eval Moderate Complexity: 1 Mod  Etta NOVAK, OT Acute Rehabilitation Services Office [phone] Secure Chat Preferred    Etta GORMAN Hope 11/24/2023, 1:19 PM

## 2023-11-25 ENCOUNTER — Inpatient Hospital Stay (HOSPITAL_COMMUNITY)

## 2023-11-25 DIAGNOSIS — E1165 Type 2 diabetes mellitus with hyperglycemia: Secondary | ICD-10-CM | POA: Diagnosis not present

## 2023-11-25 DIAGNOSIS — J9601 Acute respiratory failure with hypoxia: Secondary | ICD-10-CM | POA: Diagnosis not present

## 2023-11-25 DIAGNOSIS — I609 Nontraumatic subarachnoid hemorrhage, unspecified: Secondary | ICD-10-CM | POA: Diagnosis not present

## 2023-11-25 DIAGNOSIS — I615 Nontraumatic intracerebral hemorrhage, intraventricular: Secondary | ICD-10-CM | POA: Diagnosis not present

## 2023-11-25 DIAGNOSIS — E440 Moderate protein-calorie malnutrition: Secondary | ICD-10-CM | POA: Insufficient documentation

## 2023-11-25 DIAGNOSIS — S06311A Contusion and laceration of right cerebrum with loss of consciousness of 30 minutes or less, initial encounter: Secondary | ICD-10-CM | POA: Diagnosis not present

## 2023-11-25 DIAGNOSIS — G911 Obstructive hydrocephalus: Secondary | ICD-10-CM | POA: Diagnosis not present

## 2023-11-25 DIAGNOSIS — I618 Other nontraumatic intracerebral hemorrhage: Secondary | ICD-10-CM | POA: Diagnosis not present

## 2023-11-25 LAB — CBC
HCT: 62.8 % — ABNORMAL HIGH (ref 39.0–52.0)
HCT: 59 % — ABNORMAL HIGH (ref 39.0–52.0)
Hemoglobin: 21.3 g/dL (ref 13.0–17.0)
Hemoglobin: 20.2 g/dL — ABNORMAL HIGH (ref 13.0–17.0)
MCH: 31.8 pg (ref 26.0–34.0)
MCH: 32 pg (ref 26.0–34.0)
MCHC: 33.9 g/dL (ref 30.0–36.0)
MCHC: 34.2 g/dL (ref 30.0–36.0)
MCV: 93.7 fL (ref 80.0–100.0)
MCV: 93.4 fL (ref 80.0–100.0)
Platelets: 211 K/uL (ref 150–400)
Platelets: 135 K/uL — ABNORMAL LOW (ref 150–400)
RBC: 6.7 MIL/uL — ABNORMAL HIGH (ref 4.22–5.81)
RBC: 6.32 MIL/uL — ABNORMAL HIGH (ref 4.22–5.81)
RDW: 14.3 % (ref 11.5–15.5)
RDW: 14.2 % (ref 11.5–15.5)
WBC: 18.4 K/uL — ABNORMAL HIGH (ref 4.0–10.5)
WBC: 13 K/uL — ABNORMAL HIGH (ref 4.0–10.5)
nRBC: 0 % (ref 0.0–0.2)
nRBC: 0 % (ref 0.0–0.2)

## 2023-11-25 LAB — POCT I-STAT 7, (LYTES, BLD GAS, ICA,H+H)
Acid-Base Excess: 0 mmol/L (ref 0.0–2.0)
Acid-base deficit: 1 mmol/L (ref 0.0–2.0)
Bicarbonate: 25.5 mmol/L (ref 20.0–28.0)
Bicarbonate: 24.7 mmol/L (ref 20.0–28.0)
Calcium, Ion: 1.18 mmol/L (ref 1.15–1.40)
Calcium, Ion: 1.25 mmol/L (ref 1.15–1.40)
HCT: 58 % — ABNORMAL HIGH (ref 39.0–52.0)
HCT: 56 % — ABNORMAL HIGH (ref 39.0–52.0)
Hemoglobin: 19.7 g/dL — ABNORMAL HIGH (ref 13.0–17.0)
Hemoglobin: 19 g/dL — ABNORMAL HIGH (ref 13.0–17.0)
O2 Saturation: 100 %
O2 Saturation: 98 %
Patient temperature: 98.6
Patient temperature: 99.7
Potassium: 3.9 mmol/L (ref 3.5–5.1)
Potassium: 3.7 mmol/L (ref 3.5–5.1)
Sodium: 143 mmol/L (ref 135–145)
Sodium: 144 mmol/L (ref 135–145)
TCO2: 27 mmol/L (ref 22–32)
TCO2: 26 mmol/L (ref 22–32)
pCO2 arterial: 47.7 mmHg (ref 32–48)
pCO2 arterial: 40.5 mmHg (ref 32–48)
pH, Arterial: 7.336 — ABNORMAL LOW (ref 7.35–7.45)
pH, Arterial: 7.396 (ref 7.35–7.45)
pO2, Arterial: 416 mmHg — ABNORMAL HIGH (ref 83–108)
pO2, Arterial: 112 mmHg — ABNORMAL HIGH (ref 83–108)

## 2023-11-25 LAB — COMPREHENSIVE METABOLIC PANEL WITH GFR
ALT: 23 U/L (ref 0–44)
AST: 22 U/L (ref 15–41)
Albumin: 3.6 g/dL (ref 3.5–5.0)
Alkaline Phosphatase: 59 U/L (ref 38–126)
Anion gap: 13 (ref 5–15)
BUN: 36 mg/dL — ABNORMAL HIGH (ref 8–23)
CO2: 21 mmol/L — ABNORMAL LOW (ref 22–32)
Calcium: 9 mg/dL (ref 8.9–10.3)
Chloride: 107 mmol/L (ref 98–111)
Creatinine, Ser: 1.36 mg/dL — ABNORMAL HIGH (ref 0.61–1.24)
GFR, Estimated: 56 mL/min — ABNORMAL LOW (ref >60)
Glucose, Bld: 176 mg/dL — ABNORMAL HIGH (ref 70–99)
Potassium: 4 mmol/L (ref 3.5–5.1)
Sodium: 141 mmol/L (ref 135–145)
Total Bilirubin: 3.8 mg/dL — ABNORMAL HIGH (ref 0.0–1.2)
Total Protein: 6.7 g/dL (ref 6.5–8.1)

## 2023-11-25 LAB — GLUCOSE, CAPILLARY
Glucose-Capillary: 156 mg/dL — ABNORMAL HIGH (ref 70–99)
Glucose-Capillary: 197 mg/dL — ABNORMAL HIGH (ref 70–99)
Glucose-Capillary: 139 mg/dL — ABNORMAL HIGH (ref 70–99)
Glucose-Capillary: 162 mg/dL — ABNORMAL HIGH (ref 70–99)
Glucose-Capillary: 144 mg/dL — ABNORMAL HIGH (ref 70–99)
Glucose-Capillary: 176 mg/dL — ABNORMAL HIGH (ref 70–99)

## 2023-11-25 LAB — SODIUM
Sodium: 139 mmol/L (ref 135–145)
Sodium: 142 mmol/L (ref 135–145)

## 2023-11-25 LAB — PHOSPHORUS: Phosphorus: 2.7 mg/dL (ref 2.5–4.6)

## 2023-11-25 LAB — MAGNESIUM: Magnesium: 2.1 mg/dL (ref 1.7–2.4)

## 2023-11-25 MED ORDER — FENTANYL CITRATE PF 50 MCG/ML IJ SOSY
PREFILLED_SYRINGE | INTRAMUSCULAR | Status: AC
Start: 2023-11-25 — End: 2023-11-25
  Filled 2023-11-25: qty 2

## 2023-11-25 MED ORDER — OSMOLITE 1.5 CAL PO LIQD
1000.0000 mL | ORAL | Status: DC
  Administered 2023-11-25 – 2023-11-28 (×7): 1000 mL

## 2023-11-25 MED ORDER — ORAL CARE MOUTH RINSE
15.0000 mL | OROMUCOSAL | Status: DC
  Administered 2023-11-25 (×8): 15 mL via OROMUCOSAL

## 2023-11-25 MED ORDER — ALBUTEROL SULFATE (2.5 MG/3ML) 0.083% IN NEBU
2.5000 mg | INHALATION_SOLUTION | RESPIRATORY_TRACT | Status: DC | PRN
  Administered 2023-11-29: 2.5 mg via RESPIRATORY_TRACT
  Filled 2023-11-25 (×2): qty 3

## 2023-11-25 MED ORDER — NOREPINEPHRINE 4 MG/250ML-% IV SOLN
0.0000 ug/min | INTRAVENOUS | Status: DC
  Administered 2023-11-25 (×2): 2 ug/min via INTRAVENOUS

## 2023-11-25 MED ORDER — FAMOTIDINE IN NACL 20-0.9 MG/50ML-% IV SOLN: 20.0000 mg | Freq: Two times a day (BID) | INTRAVENOUS | Status: DC

## 2023-11-25 MED ORDER — ORAL CARE MOUTH RINSE: 15.0000 mL | OROMUCOSAL | Status: DC

## 2023-11-25 MED ORDER — PROPOFOL 1000 MG/100ML IV EMUL
INTRAVENOUS | Status: AC
  Filled 2023-11-25: qty 100

## 2023-11-25 MED ORDER — FENTANYL CITRATE PF 50 MCG/ML IJ SOSY
100.0000 ug | PREFILLED_SYRINGE | Freq: Once | INTRAMUSCULAR | Status: AC
  Administered 2023-11-25 (×2): 100 ug via INTRAVENOUS

## 2023-11-25 MED ORDER — OSMOLITE 1.5 CAL PO LIQD
1000.0000 mL | ORAL | Status: DC
  Administered 2023-11-25 (×2): 1000 mL

## 2023-11-25 MED ORDER — LABETALOL HCL 5 MG/ML IV SOLN: 10.0000 mg | INTRAVENOUS | Status: DC | PRN

## 2023-11-25 MED ORDER — LABETALOL HCL 5 MG/ML IV SOLN
10.0000 mg | INTRAVENOUS | Status: DC | PRN
  Administered 2023-11-26 (×6): 10 mg via INTRAVENOUS
  Filled 2023-11-25 (×3): qty 4

## 2023-11-25 MED ORDER — INSULIN ASPART 100 UNIT/ML IJ SOLN
2.0000 [IU] | Freq: Once | INTRAMUSCULAR | Status: AC
  Administered 2023-11-25 (×2): 2 [IU] via SUBCUTANEOUS

## 2023-11-25 MED ORDER — PROSOURCE TF20 ENFIT COMPATIBL EN LIQD
60.0000 mL | Freq: Every day | ENTERAL | Status: DC
  Administered 2023-11-25 – 2023-11-28 (×6): 60 mL
  Filled 2023-11-25 (×4): qty 60

## 2023-11-25 MED ORDER — THIAMINE MONONITRATE 100 MG PO TABS
100.0000 mg | ORAL_TABLET | Freq: Every day | ORAL | Status: DC
  Administered 2023-11-25 – 2023-11-28 (×6): 100 mg
  Filled 2023-11-25 (×4): qty 1

## 2023-11-25 MED ORDER — FAMOTIDINE 20 MG PO TABS
20.0000 mg | ORAL_TABLET | Freq: Every day | ORAL | Status: DC
  Administered 2023-11-25 – 2023-11-26 (×4): 20 mg
  Filled 2023-11-25 (×2): qty 1

## 2023-11-25 MED ORDER — ORAL CARE MOUTH RINSE: 15.0000 mL | OROMUCOSAL | Status: DC | PRN

## 2023-11-25 MED ORDER — HYDRALAZINE HCL 20 MG/ML IJ SOLN
10.0000 mg | INTRAMUSCULAR | Status: DC | PRN
  Administered 2023-11-26 – 2023-11-28 (×3): 10 mg via INTRAVENOUS
  Filled 2023-11-25 (×2): qty 1

## 2023-11-25 MED ORDER — HYDRALAZINE HCL 20 MG/ML IJ SOLN: 10.0000 mg | INTRAMUSCULAR | Status: DC | PRN

## 2023-11-25 MED ORDER — AMLODIPINE BESYLATE 5 MG PO TABS
5.0000 mg | ORAL_TABLET | Freq: Every day | ORAL | Status: DC
  Administered 2023-11-26 (×2): 5 mg
  Filled 2023-11-25: qty 1

## 2023-11-25 MED ORDER — ROCURONIUM BROMIDE 10 MG/ML (PF) SYRINGE
50.0000 mg | PREFILLED_SYRINGE | Freq: Once | INTRAVENOUS | Status: AC
  Administered 2023-11-25 (×2): 50 mg via INTRAVENOUS

## 2023-11-25 MED ORDER — INSULIN ASPART 100 UNIT/ML IJ SOLN: 0.0000 [IU] | INTRAMUSCULAR | Status: DC

## 2023-11-25 MED ORDER — ACETAMINOPHEN 325 MG PO TABS
650.0000 mg | ORAL_TABLET | Freq: Four times a day (QID) | ORAL | Status: DC | PRN
  Administered 2023-11-25 – 2023-11-29 (×17): 650 mg
  Filled 2023-11-25 (×13): qty 2

## 2023-11-25 MED ORDER — NOREPINEPHRINE 4 MG/250ML-% IV SOLN
INTRAVENOUS | Status: AC
  Filled 2023-11-25: qty 250

## 2023-11-25 MED ORDER — ORAL CARE MOUTH RINSE
15.0000 mL | OROMUCOSAL | Status: DC | PRN
Start: 2023-11-25 — End: 2023-11-25

## 2023-11-25 MED ORDER — ORAL CARE MOUTH RINSE
15.0000 mL | OROMUCOSAL | Status: DC
  Administered 2023-11-25 – 2023-11-29 (×66): 15 mL via OROMUCOSAL

## 2023-11-25 MED ORDER — FAMOTIDINE 20 MG PO TABS: 20.0000 mg | ORAL_TABLET | Freq: Two times a day (BID) | ORAL | Status: DC

## 2023-11-25 MED ORDER — INSULIN ASPART 100 UNIT/ML IJ SOLN
0.0000 [IU] | INTRAMUSCULAR | Status: DC
  Administered 2023-11-25 (×2): 2 [IU] via SUBCUTANEOUS
  Administered 2023-11-25 – 2023-11-26 (×7): 3 [IU] via SUBCUTANEOUS
  Administered 2023-11-26: 5 [IU] via SUBCUTANEOUS
  Administered 2023-11-26: 3 [IU] via SUBCUTANEOUS
  Administered 2023-11-26: 8 [IU] via SUBCUTANEOUS
  Administered 2023-11-26: 3 [IU] via SUBCUTANEOUS
  Administered 2023-11-26: 8 [IU] via SUBCUTANEOUS
  Administered 2023-11-26 (×4): 5 [IU] via SUBCUTANEOUS
  Administered 2023-11-26: 3 [IU] via SUBCUTANEOUS
  Administered 2023-11-26: 5 [IU] via SUBCUTANEOUS
  Administered 2023-11-27: 8 [IU] via SUBCUTANEOUS
  Administered 2023-11-27: 5 [IU] via SUBCUTANEOUS

## 2023-11-25 MED ORDER — FENTANYL CITRATE PF 50 MCG/ML IJ SOSY
25.0000 ug | PREFILLED_SYRINGE | INTRAMUSCULAR | Status: DC | PRN
  Administered 2023-11-25 – 2023-11-26 (×12): 50 ug via INTRAVENOUS
  Filled 2023-11-25 (×7): qty 1

## 2023-11-25 MED ORDER — LIDOCAINE-EPINEPHRINE (PF) 2 %-1:200000 IJ SOLN
INTRAMUSCULAR | Status: AC
  Administered 2023-11-25 (×2): 20 mL
  Filled 2023-11-25: qty 20

## 2023-11-25 MED ORDER — ORAL CARE MOUTH RINSE
15.0000 mL | OROMUCOSAL | Status: DC
  Administered 2023-11-25 (×2): 15 mL via OROMUCOSAL

## 2023-11-25 MED ORDER — ALBUTEROL SULFATE (2.5 MG/3ML) 0.083% IN NEBU
2.5000 mg | INHALATION_SOLUTION | RESPIRATORY_TRACT | Status: DC
  Administered 2023-11-25 – 2023-11-27 (×29): 2.5 mg via RESPIRATORY_TRACT
  Filled 2023-11-25 (×16): qty 3

## 2023-11-25 MED ORDER — MIDAZOLAM HCL 2 MG/2ML IJ SOLN
2.0000 mg | Freq: Once | INTRAMUSCULAR | Status: AC
  Administered 2023-11-25 (×2): 2 mg via INTRAVENOUS

## 2023-11-25 MED ORDER — PROPOFOL 1000 MG/100ML IV EMUL
0.0000 ug/kg/min | INTRAVENOUS | Status: DC
  Administered 2023-11-25 (×2): 20 ug/kg/min via INTRAVENOUS

## 2023-11-25 MED ORDER — FENTANYL CITRATE PF 50 MCG/ML IJ SOSY
25.0000 ug | PREFILLED_SYRINGE | INTRAMUSCULAR | Status: DC | PRN
  Filled 2023-11-25: qty 1

## 2023-11-25 MED ORDER — PROSOURCE TF20 ENFIT COMPATIBL EN LIQD: 60.0000 mL | Freq: Every day | ENTERAL | Status: DC

## 2023-11-25 NOTE — Progress Notes (Addendum)
 Please see the full note from today for staffing purposes.   In short 32 male presented after being found down.  He was noted to have left-sided weakness and was taken to CT scan and was found to have right-sided basal ganglia IPH with intraventricular extension.  He was subsequently intubated and an EVD was placed by neurosurgeon.  On my evaluation the patient is off all sedations.  Responding to sternal rub.  Not responding to verbal stimuli.    On physical exam: Intubated not sedated. Pupils equal bilaterally.  Reactive to light. Cough present.  Respond to only painful stimuli.  Moving primarily the left side of the body. Abdomen nontender nondistended. Lungs clear to auscultation.   Labs and imaging reviewed. Hemoglobin 21.3 with white cell count of 18.4. Echo with decreased RV systolic function and increased RV pressures.  RVSP could not be assessed.  EF 60-65%.  Plan: Right basal ganglia IPH with intraventricular hemorrhage: Hydrocephalus needing EVD placement: SAH: Altered mental status: - Intubated. -BP goal less than 140 systolic. - Off Levophed  drip - On BP meds.  ILD: Acute on chronic hypoxemic respiratory failure: Suspected pulmonary hypertension likely group 3: - Intubated for airway protection mainly. - Continue LT VV. - Vent bundle. - Current barrier to extubation is his mental status. - Increased RV pressures based on echo. - Will diurese when more stable.  Erythrocytosis: - Likely related to hypoxemia.  However per daughter his oxygen runs between mid 80s to low 90s.  With that level of hypoxemia unlikely to have such high hemoglobin level.  Primary erythrocytosis should be considered. - Will monitor. - Will consider heme consult once extubated and ready to transfer to floor.  AKI: - Hold nephrotoxic agents.  Hyperbilirubinemia: - Ultrasound normal.  Will trend LFTs.  DM 2: - SSI.  Full code. Continue admission in neuro ICU.   CRITICAL  CARE Performed by: Sammi JONETTA Fredericks.     Total critical care time: 30 minutes   Critical care time was exclusive of separately billable procedures and treating other patients.   Critical care was necessary to treat or prevent imminent or life-threatening deterioration.   Critical care was time spent personally by me on the following activities: development of treatment plan with patient and/or surrogate as well as nursing, discussions with consultants, evaluation of patient's response to treatment, examination of patient, obtaining history from patient or surrogate, ordering and performing treatments and interventions, ordering and review of laboratory studies, ordering and review of radiographic studies, pulse oximetry, re-evaluation of patient's condition and participation in multidisciplinary rounds.  Sammi JONETTA Fredericks, MD Pulmonary, Critical Care and Sleep Attending.  Pager: [phone]  11/25/2023, 5:57 PM

## 2023-11-25 NOTE — Progress Notes (Signed)
 Subjective: I was contacted by Dr. Vanessa this patient.  By report he changed, status.  He was intubated.  A CT scan description increasing ventriculomegaly.  I was asked to place a ventriculostomy.  I immediately came in after reviewing CT scan.  I have spoken with his daughter.  We discussed the treatment options.  I recommended placement of a ventriculostomy.  I explained the risk, benefits, alternatives, and expected postop course, and likelihood of achieving her goals with surgery.  Answered all questions.  She has consented on behalf of the patient.  Objective: Vital signs in last 24 hours: Temp:  [98 F (36.7 C)] 98 F (36.7 C) (08/11 2014) Pulse Rate:  [56-80] 80 (08/12 0000) Resp:  [10-31] 16 (08/12 0000) BP: (135-190)/(60-128) 153/128 (08/12 0000) SpO2:  [93 %-96 %] 95 % (08/11 1202) FiO2 (%):  [100 %] 100 % (08/12 0008) Estimated body mass index is 30.74 kg/m as calculated from the following:   Height as of this encounter: 5' 6 (1.676 m).   Weight as of this encounter: 86.4 kg.   Intake/Output from previous day: 08/11 0701 - 08/12 0700 In: 479.5 [I.V.:479.5] Out: 500 [Urine:500] Intake/Output this shift: No intake/output data recorded.  Physical exam Glasgow Coma Scale 6 intubated.  E1M4V1.  The patient had normal reflexes bilaterally to pain.  His pupils are small and equal.  I reviewed the patient's head CT.  He has increasing ventriculomegaly, with a right thalamic hemorrhage, etc.  Lab Results: Recent Labs    11/22/23 0609 11/24/23 0418  WBC 12.5* 13.1*  HGB 19.4* 20.1*  HCT 56.6* 58.8*  PLT 141* 156   BMET Recent Labs    11/22/23 0609 11/24/23 0418 11/25/23 0045  NA 137 138 139  K 4.2 4.1  --   CL 103 100  --   CO2 25 29  --   GLUCOSE 167* 139*  --   BUN 14 20  --   CREATININE 1.02 0.91  --   CALCIUM 8.8* 9.0  --     Studies/Results: CT HEAD WO CONTRAST ( ) Result Date: 11/25/2023 CLINICAL DATA:  Initial evaluation for acute mental  status change. EXAM: CT HEAD WITHOUT CONTRAST TECHNIQUE: Contiguous axial images were obtained from the base of the skull through the vertex without intravenous contrast. RADIATION DOSE REDUCTION: This exam was performed according to the departmental dose-optimization program which includes automated exposure control, adjustment of the mA and/or kV according to patient size and/or use of iterative reconstruction technique. COMPARISON:  Prior CT from 11/22/2023 as well as previous MRI from 11/23/2023 FINDINGS: Brain: Previously identified intraparenchymal hemorrhage centered at the superior right thalamus again seen, measuring 3.8 x 2.3 x 3.9 cm, relatively stable from prior MRI. Surrounding vasogenic edema, mildly progressed. Trace right-to-left shift at the septum pellucidum. Intraventricular extension with blood in both lateral ventricles as well as the third ventricle. Increased lateral and third ventriculomegaly since previous, compatible with associated hydrocephalus. Scattered subarachnoid hemorrhage involving both cerebral hemispheres, grossly similar to previous MRI. No other definite new intracranial hemorrhage. No other acute large vessel territory infarct. No mass lesion. No extra-axial fluid collection. Vascular: Some IV contrast material remains on board. Scattered vascular calcifications noted within the carotid siphons. Skull: Scalp soft tissues and calvarium demonstrate no new finding. Sinuses/Orbits: Globes orbital soft tissues within normal limits. Paranasal sinuses and mastoid air cells remain largely clear. Other: None. IMPRESSION: 1. 3.8 x 2.3 x 3.9 cm intraparenchymal hemorrhage centered at the superior right thalamus, relatively stable from prior  MRI. Surrounding vasogenic edema, mildly progressed. Trace right-to-left shift at the septum pellucidum. 2. Intraventricular extension with blood in both lateral ventricles as well as the third ventricle. Increased lateral and third ventriculomegaly  since previous, compatible with progressive hydrocephalus. 3. Scattered subarachnoid hemorrhage involving both cerebral hemispheres, relatively similar to prior. Electronically Signed   By: Morene Hoard M.D.   On: 11/25/2023 00:58   CT Chest High Resolution Result Date: 11/24/2023 CLINICAL DATA:  Diffuse/interstitial lung disease. EXAM: CT CHEST WITHOUT CONTRAST TECHNIQUE: Multidetector CT imaging of the chest was performed following the standard protocol without intravenous contrast. High resolution imaging of the lungs, as well as inspiratory and expiratory imaging, was performed. RADIATION DOSE REDUCTION: This exam was performed according to the departmental dose-optimization program which includes automated exposure control, adjustment of the mA and/or kV according to patient size and/or use of iterative reconstruction technique. COMPARISON:  01/20/2016. FINDINGS: Cardiovascular: Atherosclerotic calcification of the aorta, aortic valve and coronary arteries. Heart is enlarged. No pericardial effusion. Mediastinum/Nodes: No pathologically enlarged mediastinal or axillary lymph nodes. Hilar regions are difficult to definitively evaluate without IV contrast. Esophagus is grossly unremarkable. Lungs/Pleura: Image quality is degraded by expiratory phase imaging. Centrilobular emphysema. Patchy coarsened ground-glass bilaterally, with an upper and midlung zone predominance, less extensive than on 01/20/2016. There is air trapping. No pleural fluid. Debris in the airway. Upper Abdomen: Liver margin is slightly irregular. Vicarious excretion of contrast in the gallbladder. Visualized portions of the liver, gallbladder, adrenal glands, kidneys, spleen, pancreas, stomach and bowel are otherwise grossly unremarkable. No upper abdominal adenopathy. Musculoskeletal: Minimal degenerative change in the spine. IMPRESSION: 1. Pulmonary parenchymal pattern of interstitial lung disease, as detailed above, may be due to  fibrotic hypersensitivity pneumonitis or fibrotic nonspecific interstitial pneumonitis. Findings are suggestive of an alternative diagnosis (not UIP) per consensus guidelines: Diagnosis of Idiopathic Pulmonary Fibrosis: An Official ATS/ERS/JRS/ALAT Clinical Practice Guideline. Am JINNY Honey Crit Care Med Vol 198, Iss 5, [phone], Dec 14 2016. 2. Cirrhosis. 3. Aortic atherosclerosis (ICD10-I70.0). Coronary artery calcification. 4.  Emphysema (ICD10-J43.9). Electronically Signed   By: Newell Eke M.D.   On: 11/24/2023 15:32   MR BRAIN WO CONTRAST Result Date: 11/23/2023 CLINICAL DATA:  Follow-up examination for hemorrhagic stroke. EXAM: MRI HEAD WITHOUT CONTRAST TECHNIQUE: Multiplanar, multiecho pulse sequences of the brain and surrounding structures were obtained without intravenous contrast. COMPARISON:  Comparison made with CTs from 11/22/2023 FINDINGS: Brain: Examination severely degraded by motion artifact, limiting assessment. Cerebral volume within normal limits for age. Patchy T2/FLAIR hyperintensity involving the periventricular and deep white matter, consistent with chronic small vessel ischemic disease, moderate to advanced in nature. Previously identified intraparenchymal hemorrhage centered at the superior right thalamus again seen. Hemorrhage measures slightly increased in size now measuring 4.0 x 2.4 x 3.8 cm (estimated volume 18 mL, previously 12.5 mL on head CT from 11/22/2023). Mild surrounding vasogenic edema, slightly progressed. 6 mm of localized right-to-left shift at the septum pellucidum, also progressed. No visible lesion seen underlying the acute hemorrhage. Intraventricular extension again seen with blood in the lateral and third ventricles, similar. Stable ventricular size and morphology without progressive hydrocephalus. No convincing transependymal CSF. Scattered subarachnoid hemorrhage involving the bilateral cerebral hemispheres again seen, grossly similar. No other new  intracranial hemorrhage. No other acute or subacute infarct. Few additional chronic parenchymal hemorrhages noted at the left frontal lobe and left thalamic capsular region (series 12, images 19, 47). No visible mass lesion. No extra-axial fluid collection. Pituitary gland and suprasellar region grossly within normal  limits. Vascular: Major intracranial vascular flow voids are grossly maintained at the skull base. Skull and upper cervical spine: Craniocervical junction within normal limits. Bone marrow signal intensity normal. No scalp soft tissue abnormality. Sinuses/Orbits: Globes orbital soft tissues grossly within normal limits. Paranasal sinuses are largely clear. No significant mastoid effusion. Other: None. IMPRESSION: 1. Motion degraded exam. 2. 4.0 x 2.4 x 3.8 cm intraparenchymal hemorrhage centered at the superior right thalamus, slightly increased in size from previous (estimated volume 18 mL, previously 12.5 mL). Mild surrounding vasogenic edema with 6 mm of localized right-to-left shift at the septum pellucidum, mildly progressed. No visible lesion seen underlying the acute hemorrhage. 3. Intraventricular extension with blood in the lateral and third ventricles, similar. Stable ventricular size and morphology without progressive hydrocephalus. 4. Scattered subarachnoid hemorrhage involving the bilateral cerebral hemispheres, grossly similar. 5. Underlying moderate to advanced chronic microvascular ischemic disease. Electronically Signed   By: Morene Hoard M.D.   On: 11/23/2023 18:38   DG Abd 1 View Result Date: 11/23/2023 CLINICAL DATA:  [id number]. Screening for metal prior to MRI. MRI clearance film. EXAM: ABDOMEN - 1 VIEW COMPARISON:  None Available. FINDINGS: The bowel gas pattern is normal. No radio-opaque calculi or other significant radiographic abnormality are seen. There are no radiopaque foreign bodies in the field, but please note that a portion of the left lateral hemiabdomen was  excluded by collimation. There are overlying telemetry leads.  Lumbar spondylosis. IMPRESSION: 1. No radiopaque foreign bodies in the field, but please note that a portion of the left lateral hemiabdomen was excluded by collimation. 2. Lumbar spondylosis. Electronically Signed   By: Francis Quam M.D.   On: 11/23/2023 06:59    Assessment/Plan: Hydrocephalus, intraventricular hemorrhage, thalamic hemorrhage: I discussed situation with patient's daughter as above.  She has consented for placement of the ventriculostomy.  LOS: 4 days     Zachary Brown 11/25/2023, 1:21 AM     Patient ID: Zachary Brown, male   DOB: 1951/04/01, 73 y.o.   MRN: [id number]

## 2023-11-25 NOTE — Plan of Care (Signed)
 Noted overnight event. Patient back to ICU and s/p  left frontal ventriculostomy via bur hole by NS. TRH happy to assume care again when out he comes out of ICU

## 2023-11-25 NOTE — Procedures (Signed)
 Intubation Procedure Note  Zachary Brown  [id number]  1950/05/31  Date:11/25/23  Time:12:55 AM   Provider Performing:Trystan Akhtar W Rosan    Procedure: Intubation (31500)  Indication(s) Respiratory Failure  Consent Unable to obtain consent due to emergent nature of procedure.   Anesthesia Rocuronium  and Propofol    Time Out Verified patient identification, verified procedure, site/side was marked, verified correct patient position, special equipment/implants available, medications/allergies/relevant history reviewed, required imaging and test results available.   Sterile Technique Usual hand hygeine, masks, and gloves were used   Procedure Description Patient positioned in bed supine.  Sedation given as noted above.  Patient was intubated with endotracheal tube using Glidescope.  View was Grade 1 full glottis .  Number of attempts was 1.  Colorimetric CO2 detector was consistent with tracheal placement.   Complications/Tolerance None; patient tolerated the procedure well. Chest X-ray is ordered to verify placement.   EBL Minimal   Specimen(s) None    Deward Rosan, AGACNP-BC [facility]  See Amion for personal pager PCCM on call pager [phone] until 7pm. Please call Elink 7p-7a. [phone]  11/25/2023 12:55 AM

## 2023-11-25 NOTE — Op Note (Signed)
 Brief history: The patient is a 73 year old white male who was admitted with a right thalamic and intraventricular hemorrhage.  He became obtunded .[name]  He was intubated.  A follow-up scan demonstrated progressive hydrocephalus.  I was asked to place a ventriculostomy.  I obtained consent from his daughter.  Preoperative diagnosis: Right thalamic hemorrhage, intraventricular hemorrhage, hydrocephalus  Postoperative diagnosis: Same  Procedure: Placement of left frontal ventriculostomy via bur hole  Surgeon: Dr. Chyrl Budge  Assistant: None  Anesthesia: Local and propofol   Specimens: None  Drains: None  Complications: None  Description of procedure: A timeout was performed.  The patient's left frontal scalp was then shaved with clippers and prepared with DuraPrep.  Sterile drapes were applied.  I injected the area to be incised with Marcaine epinephrine  solution.  Patient scalpel to make a linear midline incision at approximately the coronal suture in the mid pupillary line.  I used the self-retaining retractor for exposure.  I then used a drill to create a left frontal burr hole.  I incised the dura with the scalpel.  I then cannulated the patient's ventricular system on the first pass.  I obtained flow of spinal fluid under high pressure.  I tunneled the ventriculostomy.  I connected the ventriculostomy to the drainage system and secured with the sutures.  The patient tolerated the procedure well.

## 2023-11-25 NOTE — Progress Notes (Signed)
   Inpatient Rehabilitation Admissions Coordinator   Noted patient vented. I will follow.  Heron Leavell, RN, MSN Rehab Admissions Coordinator [phone] 11/25/2023 10:56 AM

## 2023-11-25 NOTE — Progress Notes (Signed)
 Initial Nutrition Assessment  DOCUMENTATION CODES:   Non-severe (moderate) malnutrition in context of acute illness/injury  INTERVENTION:  Initiate tube feeding via OG: begin at 68ml/h and increase 10ml every 6 h until goal rate of 32ml/h reached  Osmolite 1.5 at 55 ml/h (1200 ml per day)   Prosource 60mL daily  Provides 2060 kcal, 102 gm protein, 1005 ml free water  daily  Pt is at risk for refeeding syndrome given acute malnutrition. Monitor magnesium and phosphorus daily x 4 days, MD to replete as needed. 100mg  thiamine  x 7 days  NUTRITION DIAGNOSIS:   Moderate Malnutrition related to acute illness (ICH) as evidenced by energy intake < or equal to 50% for > or equal to 5 days, mild muscle depletion.  GOAL:   Patient will meet greater than or equal to 90% of their needs  MONITOR:   TF tolerance  REASON FOR ASSESSMENT:   Consult Enteral/tube feeding initiation and management  ASSESSMENT:   Pt with hx of heart failure, diabetes, and chronic respiratory failure. Admitted to [facility] from [facility] after pt found down by daughter w/ L sided weakness and numbness, diagnosed R sided basal ganglia IPH.  8/8 admitted to 4N ICU 8/10 transferred to progressive unit 8/11 transferred back to ICU 54M 8/12 transferred back to 4N ICU  Received consult to initiate enteral nutrition. Spoke with pt's family at bedside. Daughter reports pt has been eating well PTA. Pt doesn't typically sit down and eat major meals, but rather eats small amounts frequently throughout the day. Daughter reports pt would eat variety of foods including fish, sardines, fruits, and vegetables. Daughter reports pt has his own garden at home that consists of melons, peas, tomatoes, squash, and okra. Daughter reports pt has not had any appetite issues leading up to admission.   Pt has been in hospital with little to no nutrition intake for 5 days which puts pt at a refeeding risk, will initiate tube feeds lower and titrate  to goal while monitoring electrolyte labs. Thiamine  100mg  supplementation initiated daily for 7 days. Nutrition focused physical exam only showed a few mild depletions of muscle, which in combination with intake < 50% of needs for >/= 5 days is indicative of acute malnutrition diagnosis. Suspect malnutrition is non-severe and EN initiation will help minimize any further losses while admitted.  Patient is currently intubated on ventilator support MV: 12.8 L/min Temp (24hrs), Avg:99 F (37.2 C), Min:98 F (36.7 C), Max:99.7 F (37.6 C) MAP (cuff):  Admit weight: 80.1 kg  Current weight: 83.7 kg   Intake/Output Summary (Last 24 hours) at 11/25/2023 1311 Last data filed at 11/25/2023 1300 Gross per 24 hour  Intake 2018.38 ml  Output 240 ml  Net 1778.38 ml   Net IO Since Admission: 293.38 mL [11/25/23 1311]  Drains/Lines: OG gastric ICP: 31 + 52 mL x 24+10 hr UOP: 500 + 150 mL x 24 + 10 hr  Nutritionally Relevant Medications: Scheduled Meds:  famotidine   20 mg Per Tube Daily   insulin  aspart  0-15 Units Subcutaneous Q4H   insulin  glargine-yfgn  6 Units Subcutaneous Daily   Continuous Infusions:  feeding supplement (OSMOLITE 1.5 CAL)     norepinephrine  (LEVOPHED ) Adult infusion Stopped (11/25/23 0947)   propofol  (DIPRIVAN ) infusion Stopped (11/25/23 0519)    Labs Reviewed: CBG ranges from 139-197 mg/dL over the last 24 hours HgbA1c no recent   NUTRITION - FOCUSED PHYSICAL EXAM:  Flowsheet Row Most Recent Value  Orbital Region No depletion  Upper Arm Region  Mild depletion  Thoracic and Lumbar Region No depletion  Buccal Region No depletion  Temple Region Mild depletion  Clavicle Bone Region Mild depletion  Clavicle and Acromion Bone Region Mild depletion  Scapular Bone Region Unable to assess  Dorsal Hand Unable to assess  Patellar Region No depletion  Anterior Thigh Region No depletion  Posterior Calf Region No depletion  Edema (RD Assessment) None  Hair  Reviewed  Eyes Unable to assess  Mouth Unable to assess  Skin Reviewed  Nails Reviewed    Diet Order:   Diet Order             Diet NPO time specified  Diet effective now                   EDUCATION NEEDS:   Not appropriate for education at this time  Skin:  Skin Assessment: Reviewed RN Assessment  Last BM:  PTA  Height:   Ht Readings from Last 1 Encounters:  11/25/23 5' 6 (1.676 m)    Weight:   Wt Readings from Last 1 Encounters:  11/25/23 83.7 kg    Ideal Body Weight:  64.5 kg  BMI:  Body mass index is 29.78 kg/m.  Estimated Nutritional Needs:   Kcal:  1900-2100  Protein:  85-105g  Fluid:  1.9-2.1L    Josette Glance, MS, RDN, LDN Clinical Dietitian I Please reach out via secure chat

## 2023-11-25 NOTE — Progress Notes (Signed)
 PT Cancellation Note  Patient Details Name: Zachary Brown MRN: [id number] DOB: 08/04/1950   Cancelled Treatment:    Reason Eval/Treat Not Completed: Medical issues which prohibited therapy. Pt with decline in mental status overnight and required EVD placement, remaining intubated post-procedure. PT will follow up once pt is appropriate to mobilize.   [name] 11/25/2023, 9:26 AM

## 2023-11-25 NOTE — Progress Notes (Signed)
 NEUROLOGY CONSULT FOLLOW UP NOTE   Date of service: November 25, 2023 Patient Name: Zachary Brown MRN:  [id number] DOB:  Sep 13, 1950  Interval Hx/subjective   Was notified of a decline in mentation. Patient I'm unarousable. Vitals with BP in 150s, HR in 60s, no fever, has shallow breaths, tachypneic. Glucose in 150s.  Obtianing STAT CT Head.  Vitals   Vitals:   11/24/23 1515 11/24/23 1530 11/24/23 1545 11/24/23 2014  BP: 135/87  (!) 168/80 (!) 155/101  Pulse:    68  Resp: (!) 31 16 19 18   Temp:    98 F (36.7 C)  TempSrc:    Axillary  SpO2:      Weight:         Body mass index is 28.13 kg/m.  Physical Exam   General: on oxygen, unarousable. HENT: Normal oropharynx and mucosa. Normal external appearance of ears and nose. Neck: Supple, no pain or tenderness  CV: No JVD. No peripheral edema.  Pulmonary: Symmetric Chest rise. Tachypneic and shallow breaths. Abdomen: Soft to touch, non-tender. Ext: No cyanosis, edema, or deformity  Skin: No rash. Normal palpation of skin.   Musculoskeletal: Normal digits and nails by inspection. No clubbing.   Neurologic Examination  Mental status/Cognition: obtunded, unarousable. No response to loud voice, slight grimace to noxious and nares stimulation. Speech/language: mute, no speech Cranial nerves:   CN II Pupils 3mm, round with sluggish reaction to bright light.   CN III,IV,VI No gaze preference or deviation, roving eye movements.   CN V Corneals weakly positive BL   CN VII Facial diplegia.   CN VIII Dopes not open eyes or turn head towards speech   CN IX & X No gag.   CN XI Head midline.   CN XII Does not protrude tongue on command.   Sensory/Motor:  Muscle bulk: normal, tone flaccid in all extremities.  Moves right side slightly more than left. No antigravity movement noted in any extremities. Withdraws BL lower extremities to proximal pinch Very little movement noted in BL uppers to proximal pinch.  Coordination/Complex  Motor:  Unable to assess.  Medications  Current Facility-Administered Medications:    0.9 %  sodium chloride  infusion, , Intravenous, Continuous, Gonfa, Taye T, MD, Last Rate: 75 mL/hr at 11/24/23 1826, Infusion Verify at 11/24/23 1826   acetaminophen  (TYLENOL ) tablet 650 mg, 650 mg, Oral, Q4H PRN, Gretta Leita SQUIBB, DO, 650 mg at 11/23/23 2011   amLODipine  (NORVASC ) tablet 5 mg, 5 mg, Oral, Daily, Gonfa, Taye T, MD, 5 mg at 11/24/23 0849   Chlorhexidine  Gluconate Cloth 2 % PADS 6 each, 6 each, Topical, Q0600, Byrum, Robert S, MD, 6 each at 11/24/23 0536   docusate sodium  (COLACE) capsule 100 mg, 100 mg, Oral, BID PRN, Gretta Leita P, DO   famotidine  (PEPCID ) tablet 20 mg, 20 mg, Oral, BID, Gretta Leita P, DO, 20 mg at 11/24/23 0849   hydrALAZINE  (APRESOLINE ) injection 10 mg, 10 mg, Intravenous, Q4H PRN, Gonfa, Taye T, MD, 10 mg at 11/24/23 2246   hydrALAZINE  (APRESOLINE ) tablet 25 mg, 25 mg, Oral, Q8H, Gonfa, Taye T, MD, 25 mg at 11/24/23 1152   insulin  aspart (novoLOG ) injection 0-5 Units, 0-5 Units, Subcutaneous, QHS, Gonfa, Taye T, MD   insulin  aspart (novoLOG ) injection 0-9 Units, 0-9 Units, Subcutaneous, TID WC, Gonfa, Taye T, MD, 2 Units at 11/24/23 1619   insulin  glargine-yfgn (SEMGLEE ) injection 6 Units, 6 Units, Subcutaneous, Daily, Gonfa, Taye T, MD, 6 Units at 11/24/23 1233   losartan  (  COZAAR ) tablet 100 mg, 100 mg, Oral, Daily, Gonfa, Taye T, MD, 100 mg at 11/24/23 0849   midazolam  (VERSED ) 2 MG/2ML injection, , , ,    ondansetron  (ZOFRAN ) injection 4 mg, 4 mg, Intravenous, Q6H PRN, Gretta Leita SQUIBB, DO, 4 mg at 11/23/23 1151   Oral care mouth rinse, 15 mL, Mouth Rinse, PRN, Byrum, Lamar RAMAN, MD   polyethylene glycol (MIRALAX  / GLYCOLAX ) packet 17 g, 17 g, Oral, Daily PRN, Gretta Leita P, DO   propofol  (DIPRIVAN ) 1000 MG/100ML infusion, , , ,    sodium chloride  3% (hypertonic) IV bolus 250 mL, 250 mL, Intravenous, Once, Vanessa Robert, MD  Labs and Diagnostic Imaging   CBC:   Recent Labs  Lab 11/22/23 0609 11/24/23 0418  WBC 12.5* 13.1*  HGB 19.4* 20.1*  HCT 56.6* 58.8*  MCV 92.2 92.5  PLT 141* 156    Basic Metabolic Panel:  Lab Results  Component Value Date   NA 138 11/24/2023   K 4.1 11/24/2023   CO2 29 11/24/2023   GLUCOSE 139 (H) 11/24/2023   BUN 20 11/24/2023   CREATININE 0.91 11/24/2023   CALCIUM 9.0 11/24/2023   GFRNONAA >60 11/24/2023   GFRAA >60 01/29/2016   Lipid Panel:  Lab Results  Component Value Date   LDLCALC 47 11/22/2023   HgbA1c:  Lab Results  Component Value Date   HGBA1C 8.0 (H) 01/29/2016   Urine Drug Screen: No results found for: LABOPIA, COCAINSCRNUR, LABBENZ, AMPHETMU, THCU, LABBARB  Alcohol  Level No results found for: [facility] INR  Lab Results  Component Value Date   INR 1.16 01/22/2016   APTT  Lab Results  Component Value Date   APTT 32 01/22/2016   AED levels: No results found for: PHENYTOIN, ZONISAMIDE, LAMOTRIGINE, LEVETIRACETA  CT Head without contrast(Personally reviewed): Developing obstructive hydrocephalus. ICH looks stable in size.  Assessment   Zachary Brown is a 73 y.o. male with history of COPD, heart failure, presents with left-sided weakness that started abruptly found to have a Intracerebral Hemorrhage and IVH right thalamus, multifocal subarachnoid hemorrhage  Etiology: Likely hypertensive.  Declined overnight and unarousable. Now has new obstructive hydrocephalus.  Recommendations  - Hypertonic saline 250cc bolus STAT. - Patient transferred to ICU. Medical ICU has a open bed, 4N is waiting on a bed being cleaned. Patient transferred to medical ICU for now with plan for a lateral transfer to 4N ICU once we have the bed cleaned. - Neurosurgery called and spoke with Gerard Beck and Dr. Mavis. Plan to do a EVD overnight. - called and spoke with daughter Ms. Delon Dawn and updated her. - Spoke with Critical Care team for concern for airway, patient being  intubated. - called and spoke with overnight Hospitalist Dr. Franky to update on transfer to medical ICU. - Patient is not on [facility], antiplatelet, CBC yesterday AM with platelet count of 156. - Goal SBP overnight 130-150 systolic. Will use PRN labetalol  and cleviprex as needed to keep SBP within goal. - Frequent neuro checks; q1hour - Stroke team to follow  ______________________________________________________________________  This patient is critically ill and at significant risk of neurological worsening, death and care requires constant monitoring of vital signs, hemodynamics,respiratory and cardiac monitoring, neurological assessment, discussion with family, other specialists and medical decision making of high complexity. I spent 50 minutes of neurocritical care time  in the care of  this patient. This was time spent independent of any time provided by nurse practitioner or PA.  Celia Friedland [facility] 11/25/2023  12:20 AM   Signed, Tymel Conely, MD [facility]

## 2023-11-25 NOTE — Progress Notes (Signed)
 Interim CCM Progress Note:  General: acute on chronically ill appearing, lying in icu bed on vent in NAD  HEENT: Normocephalic- EVD drain at 10 , PERRLA intact-sluggish, ETT, Pink MM CV: s1,s2, RRR, no MRG, No JVD  pulm: clear, diminished, no distress on vent  Abs: bs active, soft  Extremities: no edema, no deformity, moves to painful stimuli, does not follow commands Skin: no rash  Neuro: Rass -2, responds to painful stimuli, cough present, absent gag  GU: male purewick   Intraparenchymal hemorrhage: right basal ganglia with extension into the right lateral and third ventricles. Concern for hypertensive bleed based on location, but did fall and he has scalp soft tissue injury.  Intraventricular hemorrhage Subarachnoid hemorrhage 11/24/23 - repeat head CT reviewed, showing slight worsening of ICH with ventricular extension - Neurologist bedside discussed with Neurosurgeon - planning for EVD placement. P: CBP goal 130 to 150  Started on levophed  gtt overnight due to hypotension with sedation, titrating off levophed - continue norvasc  5mg  but would hold losartan  today, continue PRN hydralazine , labetalol  if SBP >150 Continue to wean off levophed  gtt  Continue neuro protective measures- euvolemia, euglycemia, normoxia, normocapnia, maintain electrolytes within normal ranges    Acute on chronic hypoxic respiratory failure requiring vent for airway protection secondary to above  - intubated on 11/25/23 for mental status changes - resume vent bundle/protocol. Possible ILD: started workup in pulm office 2017 but lost to follow up.  Has not had access to his home oxygen, concentrator has been broken.  - - former baseline 2 L/min although he has not used the oxygen for an extended time as it has not been available. Ct Chest high resolution on 8/11- Pulmonary parenchymal pattern of interstitial lung disease, as detailed above, may be due to fibrotic hypersensitivity pneumonitis or fibrotic nonspecific  interstitial pneumonitis.  P:  Continue ventilator support and lung protective strategies  Continue LTVV  Wean PEEP and Fio2 requirements to sat goal of >90%  HOB > 30 degrees Plat < 30  Aim for Driving pressures < 15  Intermittent Chest X-ray and ABGS VAP and PAD protocols in place  Continue to hold off sedation at this time, will attempt WUA/SBT tomorrow on 8/12   AKI  Cr 1.36 from 0.91  Possibly in setting of hypotension overnight and from IPH as above P: Continue to trend renal function daily  Continue to monitor and optimize electrolytes daily Continue to monitor urine output Continue strict I/Os Continue Adequate renal perfusion  Avoid nephrotoxic agents    Hx HFpEF ? Syncope Secondary PAH based on echocardiogram Suspect that PAH is due to noncompliance with oxygen over the last several years.  Further workup as an outpatient appropriate including screening for VTE, OSA if clinical suspicion, etc. P: Continue Cardiac tele Continue to evaluate fluid volume status daily Wean off levophed  gtt    Elevated bilirubin 8/8 US  Limited RUQ- no gall stones or wall thickening, normal US   P: Continue to follow bili  Repeat CMET tomorrow morning on 8/13    DM - Unclear whether he was taking metformin  as per his medication list. P: Continue to hold metformin   Continue CBG, SSI, q 4 CBGs RD consult to start tube feeds, may eventually need tube feed coverage   Sherlean Sharps AGACNP-BC   [facility] 11/25/2023, 10:49 AM  Please see [email or web address] for pager details.  From 7A-7P if no response, please call [phone]. After hours, please call ELink [phone].

## 2023-11-25 NOTE — Plan of Care (Signed)
   Problem: Clinical Measurements: Goal: Will remain free from infection Outcome: Progressing Goal: Cardiovascular complication will be avoided Outcome: Progressing   Problem: Nutrition: Goal: Adequate nutrition will be maintained Outcome: Progressing   Problem: Coping: Goal: Level of anxiety will decrease Outcome: Progressing   Problem: Elimination: Goal: Will not experience complications related to bowel motility Outcome: Progressing

## 2023-11-25 NOTE — Progress Notes (Signed)
 Subjective: The patient is intubated and in no apparent distress.  His daughter is at the bedside.  Objective: Vital signs in last 24 hours: Temp:  [98 F (36.7 C)-99 F (37.2 C)] 99 F (37.2 C) (08/12 0400) Pulse Rate:  [56-91] 70 (08/12 0700) Resp:  [10-31] 26 (08/12 0700) BP: (80-190)/(54-128) 142/82 (08/12 0700) SpO2:  [93 %-98 %] 95 % (08/12 0700) FiO2 (%):  [40 %-100 %] 40 % (08/12 0400) Weight:  [83.7 kg] 83.7 kg (08/12 0702) Estimated body mass index is 29.78 kg/m as calculated from the following:   Height as of this encounter: 5' 6 (1.676 m).   Weight as of this encounter: 83.7 kg.   Intake/Output from previous day: 08/11 0701 - 08/12 0700 In: 1561 [I.V.:1310.9; IV Piggyback:250.1] Out: 531 [Urine:500; Drains:31] Intake/Output this shift: No intake/output data recorded.  Physical exam Glasgow Coma Scale 7, intubated, [id number].  His pupils are equal.  He will abnormally flexed the pain/fidget.  He has left hemiparetic, moving his right side more.  His ventriculostomy is patent.  Lab Results: Recent Labs    11/24/23 0418 11/25/23 0136  WBC 13.1*  --   HGB 20.1* 19.7*  HCT 58.8* 58.0*  PLT 156  --    BMET Recent Labs    11/24/23 0418 11/25/23 0045 11/25/23 0136  NA 138 139 143  K 4.1  --  3.9  CL 100  --   --   CO2 29  --   --   GLUCOSE 139*  --   --   BUN 20  --   --   CREATININE 0.91  --   --   CALCIUM 9.0  --   --     Studies/Results: DG CHEST PORT 1 VIEW Result Date: 11/25/2023 CLINICAL DATA:  Endotracheal tube placement EXAM: PORTABLE CHEST 1 VIEW COMPARISON:  12:19 a.m. FINDINGS: Endotracheal tube has been slightly advanced with its tip now seen 4.1 cm above the carina. Sparse multifocal pulmonary infiltrates again identified. The lungs remain symmetrically well expanded. No pneumothorax or pleural effusion. Cardiac size within normal limits. No acute bone abnormality. IMPRESSION: 1. Endotracheal tube slightly advanced with its tip now seen 4.1  cm above the carina. 2. Stable multifocal pulmonary infiltrates. Electronically Signed   By: Dorethia Molt M.D.   On: 11/25/2023 01:46   Portable Chest x-ray Result Date: 11/25/2023 CLINICAL DATA:  Endotracheal tube placement EXAM: PORTABLE CHEST 1 VIEW COMPARISON:  11/21/2023 FINDINGS: Endotracheal tube is seen 5.3 cm above the carina. The lungs are symmetrically well expanded. Patchy multifocal pulmonary infiltrates again noted, stable. No pneumothorax or pleural effusion. Cardiac size within limits. Pulmonary vascularity is normal. No acute bone abnormality. IMPRESSION: 1. Endotracheal tube 5.3 cm above the carina. 2. Stable multifocal pulmonary infiltrates. Electronically Signed   By: Dorethia Molt M.D.   On: 11/25/2023 01:45   CT HEAD WO CONTRAST ( ) Result Date: 11/25/2023 CLINICAL DATA:  Initial evaluation for acute mental status change. EXAM: CT HEAD WITHOUT CONTRAST TECHNIQUE: Contiguous axial images were obtained from the base of the skull through the vertex without intravenous contrast. RADIATION DOSE REDUCTION: This exam was performed according to the departmental dose-optimization program which includes automated exposure control, adjustment of the mA and/or kV according to patient size and/or use of iterative reconstruction technique. COMPARISON:  Prior CT from 11/22/2023 as well as previous MRI from 11/23/2023 FINDINGS: Brain: Previously identified intraparenchymal hemorrhage centered at the superior right thalamus again seen, measuring 3.8 x 2.3 x 3.9 cm, relatively  stable from prior MRI. Surrounding vasogenic edema, mildly progressed. Trace right-to-left shift at the septum pellucidum. Intraventricular extension with blood in both lateral ventricles as well as the third ventricle. Increased lateral and third ventriculomegaly since previous, compatible with associated hydrocephalus. Scattered subarachnoid hemorrhage involving both cerebral hemispheres, grossly similar to previous MRI. No  other definite new intracranial hemorrhage. No other acute large vessel territory infarct. No mass lesion. No extra-axial fluid collection. Vascular: Some IV contrast material remains on board. Scattered vascular calcifications noted within the carotid siphons. Skull: Scalp soft tissues and calvarium demonstrate no new finding. Sinuses/Orbits: Globes orbital soft tissues within normal limits. Paranasal sinuses and mastoid air cells remain largely clear. Other: None. IMPRESSION: 1. 3.8 x 2.3 x 3.9 cm intraparenchymal hemorrhage centered at the superior right thalamus, relatively stable from prior MRI. Surrounding vasogenic edema, mildly progressed. Trace right-to-left shift at the septum pellucidum. 2. Intraventricular extension with blood in both lateral ventricles as well as the third ventricle. Increased lateral and third ventriculomegaly since previous, compatible with progressive hydrocephalus. 3. Scattered subarachnoid hemorrhage involving both cerebral hemispheres, relatively similar to prior. Electronically Signed   By: Morene Hoard M.D.   On: 11/25/2023 00:58   CT Chest High Resolution Result Date: 11/24/2023 CLINICAL DATA:  Diffuse/interstitial lung disease. EXAM: CT CHEST WITHOUT CONTRAST TECHNIQUE: Multidetector CT imaging of the chest was performed following the standard protocol without intravenous contrast. High resolution imaging of the lungs, as well as inspiratory and expiratory imaging, was performed. RADIATION DOSE REDUCTION: This exam was performed according to the departmental dose-optimization program which includes automated exposure control, adjustment of the mA and/or kV according to patient size and/or use of iterative reconstruction technique. COMPARISON:  01/20/2016. FINDINGS: Cardiovascular: Atherosclerotic calcification of the aorta, aortic valve and coronary arteries. Heart is enlarged. No pericardial effusion. Mediastinum/Nodes: No pathologically enlarged mediastinal or  axillary lymph nodes. Hilar regions are difficult to definitively evaluate without IV contrast. Esophagus is grossly unremarkable. Lungs/Pleura: Image quality is degraded by expiratory phase imaging. Centrilobular emphysema. Patchy coarsened ground-glass bilaterally, with an upper and midlung zone predominance, less extensive than on 01/20/2016. There is air trapping. No pleural fluid. Debris in the airway. Upper Abdomen: Liver margin is slightly irregular. Vicarious excretion of contrast in the gallbladder. Visualized portions of the liver, gallbladder, adrenal glands, kidneys, spleen, pancreas, stomach and bowel are otherwise grossly unremarkable. No upper abdominal adenopathy. Musculoskeletal: Minimal degenerative change in the spine. IMPRESSION: 1. Pulmonary parenchymal pattern of interstitial lung disease, as detailed above, may be due to fibrotic hypersensitivity pneumonitis or fibrotic nonspecific interstitial pneumonitis. Findings are suggestive of an alternative diagnosis (not UIP) per consensus guidelines: Diagnosis of Idiopathic Pulmonary Fibrosis: An Official ATS/ERS/JRS/ALAT Clinical Practice Guideline. Am JINNY Honey Crit Care Med Vol 198, Iss 5, [phone], Dec 14 2016. 2. Cirrhosis. 3. Aortic atherosclerosis (ICD10-I70.0). Coronary artery calcification. 4.  Emphysema (ICD10-J43.9). Electronically Signed   By: Newell Eke M.D.   On: 11/24/2023 15:32   MR BRAIN WO CONTRAST Result Date: 11/23/2023 CLINICAL DATA:  Follow-up examination for hemorrhagic stroke. EXAM: MRI HEAD WITHOUT CONTRAST TECHNIQUE: Multiplanar, multiecho pulse sequences of the brain and surrounding structures were obtained without intravenous contrast. COMPARISON:  Comparison made with CTs from 11/22/2023 FINDINGS: Brain: Examination severely degraded by motion artifact, limiting assessment. Cerebral volume within normal limits for age. Patchy T2/FLAIR hyperintensity involving the periventricular and deep white matter, consistent  with chronic small vessel ischemic disease, moderate to advanced in nature. Previously identified intraparenchymal hemorrhage centered at the superior right thalamus again  seen. Hemorrhage measures slightly increased in size now measuring 4.0 x 2.4 x 3.8 cm (estimated volume 18 mL, previously 12.5 mL on head CT from 11/22/2023). Mild surrounding vasogenic edema, slightly progressed. 6 mm of localized right-to-left shift at the septum pellucidum, also progressed. No visible lesion seen underlying the acute hemorrhage. Intraventricular extension again seen with blood in the lateral and third ventricles, similar. Stable ventricular size and morphology without progressive hydrocephalus. No convincing transependymal CSF. Scattered subarachnoid hemorrhage involving the bilateral cerebral hemispheres again seen, grossly similar. No other new intracranial hemorrhage. No other acute or subacute infarct. Few additional chronic parenchymal hemorrhages noted at the left frontal lobe and left thalamic capsular region (series 12, images 19, 47). No visible mass lesion. No extra-axial fluid collection. Pituitary gland and suprasellar region grossly within normal limits. Vascular: Major intracranial vascular flow voids are grossly maintained at the skull base. Skull and upper cervical spine: Craniocervical junction within normal limits. Bone marrow signal intensity normal. No scalp soft tissue abnormality. Sinuses/Orbits: Globes orbital soft tissues grossly within normal limits. Paranasal sinuses are largely clear. No significant mastoid effusion. Other: None. IMPRESSION: 1. Motion degraded exam. 2. 4.0 x 2.4 x 3.8 cm intraparenchymal hemorrhage centered at the superior right thalamus, slightly increased in size from previous (estimated volume 18 mL, previously 12.5 mL). Mild surrounding vasogenic edema with 6 mm of localized right-to-left shift at the septum pellucidum, mildly progressed. No visible lesion seen underlying the  acute hemorrhage. 3. Intraventricular extension with blood in the lateral and third ventricles, similar. Stable ventricular size and morphology without progressive hydrocephalus. 4. Scattered subarachnoid hemorrhage involving the bilateral cerebral hemispheres, grossly similar. 5. Underlying moderate to advanced chronic microvascular ischemic disease. Electronically Signed   By: Morene Hoard M.D.   On: 11/23/2023 18:38    Assessment/Plan: Thalamic hemorrhage, intraventricular hemorrhage, hydrocephalus: He has improved a bit with a ventriculostomy.  Dr. Debby will follow.  LOS: 4 days     Zachary Brown 11/25/2023, 7:39 AM     Patient ID: Zachary Brown, male   DOB: [birth year], 73 y.o.   MRN: [id number]

## 2023-11-25 NOTE — Progress Notes (Addendum)
 STROKE TEAM PROGRESS NOTE   INTERIM HISTORY/SUBJECTIVE  Overnight--Patient became unarousable with elevated blood pressure, tachypneic. Stat CT head showed developing obstructive hydrocephalus.  Patient was intubated and transferred to ICU.  EVD was placed by Dr. Mavis.  This morning, family at the bedside, thorough discussion held concerning patient's assessment, prognosis and plan of care.    EVD draining approx 24ml/hr, serosang No sedation during Neuro exam but exam remains poor with patient unresponsive not following commands.  Pupils are small but sluggishly reactive.  Doll's eye movements are sluggish. RUE purposeful, LUE withdraw only  Will start tube feeds BP 130-150 for first 24 hours acutely, then <160 Repeat CT Head ordered for tomorrow AM.   CBC    Component Value Date/Time   WBC 18.4 (H) 11/25/2023 0658   RBC 6.70 (H) 11/25/2023 0658   HGB 21.3 (HH) 11/25/2023 0658   HCT 62.8 (H) 11/25/2023 0658   PLT 211 11/25/2023 0658   MCV 93.7 11/25/2023 0658   MCH 31.8 11/25/2023 0658   MCHC 33.9 11/25/2023 0658   RDW 14.3 11/25/2023 0658   LYMPHSABS 0.5 (L) 01/22/2016 0547   MONOABS 0.2 01/22/2016 0547   EOSABS 0.0 01/22/2016 0547   BASOSABS 0.0 01/22/2016 0547    BMET    Component Value Date/Time   NA 141 11/25/2023 0658   K 4.0 11/25/2023 0658   CL 107 11/25/2023 0658   CO2 21 (L) 11/25/2023 0658   GLUCOSE 176 (H) 11/25/2023 0658   BUN 36 (H) 11/25/2023 0658   CREATININE 1.36 (H) 11/25/2023 0658   CALCIUM 9.0 11/25/2023 0658   GFRNONAA 56 (L) 11/25/2023 0658    IMAGING past 24 hours DG CHEST PORT 1 VIEW Result Date: 11/25/2023 CLINICAL DATA:  Endotracheal tube placement EXAM: PORTABLE CHEST 1 VIEW COMPARISON:  12:19 a.m. FINDINGS: Endotracheal tube has been slightly advanced with its tip now seen 4.1 cm above the carina. Sparse multifocal pulmonary infiltrates again identified. The lungs remain symmetrically well expanded. No pneumothorax or pleural effusion.  Cardiac size within normal limits. No acute bone abnormality. IMPRESSION: 1. Endotracheal tube slightly advanced with its tip now seen 4.1 cm above the carina. 2. Stable multifocal pulmonary infiltrates. Electronically Signed   By: Dorethia Molt M.D.   On: 11/25/2023 01:46   Portable Chest x-ray Result Date: 11/25/2023 CLINICAL DATA:  Endotracheal tube placement EXAM: PORTABLE CHEST 1 VIEW COMPARISON:  11/21/2023 FINDINGS: Endotracheal tube is seen 5.3 cm above the carina. The lungs are symmetrically well expanded. Patchy multifocal pulmonary infiltrates again noted, stable. No pneumothorax or pleural effusion. Cardiac size within limits. Pulmonary vascularity is normal. No acute bone abnormality. IMPRESSION: 1. Endotracheal tube 5.3 cm above the carina. 2. Stable multifocal pulmonary infiltrates. Electronically Signed   By: Dorethia Molt M.D.   On: 11/25/2023 01:45   CT HEAD WO CONTRAST ( ) Result Date: 11/25/2023 CLINICAL DATA:  Initial evaluation for acute mental status change. EXAM: CT HEAD WITHOUT CONTRAST TECHNIQUE: Contiguous axial images were obtained from the base of the skull through the vertex without intravenous contrast. RADIATION DOSE REDUCTION: This exam was performed according to the departmental dose-optimization program which includes automated exposure control, adjustment of the mA and/or kV according to patient size and/or use of iterative reconstruction technique. COMPARISON:  Prior CT from 11/22/2023 as well as previous MRI from 11/23/2023 FINDINGS: Brain: Previously identified intraparenchymal hemorrhage centered at the superior right thalamus again seen, measuring 3.8 x 2.3 x 3.9 cm, relatively stable from prior MRI. Surrounding vasogenic edema, mildly progressed.  Trace right-to-left shift at the septum pellucidum. Intraventricular extension with blood in both lateral ventricles as well as the third ventricle. Increased lateral and third ventriculomegaly since previous, compatible  with associated hydrocephalus. Scattered subarachnoid hemorrhage involving both cerebral hemispheres, grossly similar to previous MRI. No other definite new intracranial hemorrhage. No other acute large vessel territory infarct. No mass lesion. No extra-axial fluid collection. Vascular: Some IV contrast material remains on board. Scattered vascular calcifications noted within the carotid siphons. Skull: Scalp soft tissues and calvarium demonstrate no new finding. Sinuses/Orbits: Globes orbital soft tissues within normal limits. Paranasal sinuses and mastoid air cells remain largely clear. Other: None. IMPRESSION: 1. 3.8 x 2.3 x 3.9 cm intraparenchymal hemorrhage centered at the superior right thalamus, relatively stable from prior MRI. Surrounding vasogenic edema, mildly progressed. Trace right-to-left shift at the septum pellucidum. 2. Intraventricular extension with blood in both lateral ventricles as well as the third ventricle. Increased lateral and third ventriculomegaly since previous, compatible with progressive hydrocephalus. 3. Scattered subarachnoid hemorrhage involving both cerebral hemispheres, relatively similar to prior. Electronically Signed   By: Morene Hoard M.D.   On: 11/25/2023 00:58   CT Chest High Resolution Result Date: 11/24/2023 CLINICAL DATA:  Diffuse/interstitial lung disease. EXAM: CT CHEST WITHOUT CONTRAST TECHNIQUE: Multidetector CT imaging of the chest was performed following the standard protocol without intravenous contrast. High resolution imaging of the lungs, as well as inspiratory and expiratory imaging, was performed. RADIATION DOSE REDUCTION: This exam was performed according to the departmental dose-optimization program which includes automated exposure control, adjustment of the mA and/or kV according to patient size and/or use of iterative reconstruction technique. COMPARISON:  01/20/2016. FINDINGS: Cardiovascular: Atherosclerotic calcification of the aorta, aortic  valve and coronary arteries. Heart is enlarged. No pericardial effusion. Mediastinum/Nodes: No pathologically enlarged mediastinal or axillary lymph nodes. Hilar regions are difficult to definitively evaluate without IV contrast. Esophagus is grossly unremarkable. Lungs/Pleura: Image quality is degraded by expiratory phase imaging. Centrilobular emphysema. Patchy coarsened ground-glass bilaterally, with an upper and midlung zone predominance, less extensive than on 01/20/2016. There is air trapping. No pleural fluid. Debris in the airway. Upper Abdomen: Liver margin is slightly irregular. Vicarious excretion of contrast in the gallbladder. Visualized portions of the liver, gallbladder, adrenal glands, kidneys, spleen, pancreas, stomach and bowel are otherwise grossly unremarkable. No upper abdominal adenopathy. Musculoskeletal: Minimal degenerative change in the spine. IMPRESSION: 1. Pulmonary parenchymal pattern of interstitial lung disease, as detailed above, may be due to fibrotic hypersensitivity pneumonitis or fibrotic nonspecific interstitial pneumonitis. Findings are suggestive of an alternative diagnosis (not UIP) per consensus guidelines: Diagnosis of Idiopathic Pulmonary Fibrosis: An Official ATS/ERS/JRS/ALAT Clinical Practice Guideline. Am JINNY Honey Crit Care Med Vol 198, Iss 5, [phone], Dec 14 2016. 2. Cirrhosis. 3. Aortic atherosclerosis (ICD10-I70.0). Coronary artery calcification. 4.  Emphysema (ICD10-J43.9). Electronically Signed   By: Newell Eke M.D.   On: 11/24/2023 15:32     Vitals:   11/25/23 0915 11/25/23 0930 11/25/23 0945 11/25/23 1000  BP: (!) 144/86 (!) 155/88 (!) 155/100 138/78  Pulse: 81 70 86 73  Resp: (!) 24 (!) 26 (!) 24 (!) 26  Temp:      TempSrc:      SpO2: 97% 95% 92% 95%  Weight:      Height:         PHYSICAL EXAM General:  Critically ill intubated patient CV: Regular rate and rhythm on monitor Respiratory: Mechanically ventilated  NEURO:  Mental Status:  Intubated, no sedation.  Somnolent, barely opens eyes  to pain. Does not follow commands.   Cranial Nerves:  II: Pupils sluggish bilaterally. III, IV, VI: Does not track examiner. V: Sensation is intact to light touch and symmetrical to face.  VII: UTA due to ETT IX, X: Weak cough and gag present.  Motor:/Sensory: Purposeful RUE, BLE Minor withdraw LUE  Most Recent NIH    ASSESSMENT/PLAN  Zachary Brown is a 73 y.o. male with history of COPD, heart failure, presents with left-sided weakness that started abruptly   NIH on Admission 5  Intracerebral Hemorrhage and IVH  right thalamus, multifocal subarachnoid hemorrhage  Etiology: Likely hypertensive CT head Stable 3 cm hemorrhage in the right thalamus and intraventricular hemorrhage, right greater than left. 2. New multifocal subarachnoid hemorrhage bilaterally, most prominent over the left parietal lobe, right frontal and perculum, and in the occipital lobes bilaterally. CTA head & neck no LVO Overnight CTH 8/12 (due to mental status decline) 3.8 x 2.3 x 3.9 cm intraparenchymal hemorrhage centered at the superior right thalamus, relatively stable from prior MRI. Surrounding vasogenic edema, mildly progressed. Trace right-to-left shift at the septum pellucidum. Intraventricular extension with blood in both lateral ventricles as well as the third ventricle. Increased lateral and third ventriculomegaly since previous, compatible with progressive hydrocephalus. Scattered subarachnoid hemorrhage involving both cerebral hemispheres, relatively similar to prior 8/13 CT Head: Ordered MRI 8/10  4.0 x 2.4 x 3.8 cm intraparenchymal hemorrhage centered at the superior right thalamus, slightly increased in size from previous (estimated volume 18 mL, previously 12.5 mL). Intraventricular extension with blood in the lateral and third ventricles, similar. Stable ventricular size and morphology without progressive hydrocephalus. 2D Echo ejection  fraction 60 to 65%.  Left atrial size normal. LDL 47 HgbA1c pending VTE prophylaxis -SCDs aspirin  81 mg daily prior to admission, continue No antithrombotic due to acute hemorrhage Therapy recommendations:  Pending Disposition: Pending  Obstructive Hydrocephalus EVD in place Draining approx 55ml/hr, serosang Managed by neurosurgery  Acute on chronic respiratory failure with hypoxia Intubated overnight due to decreased mental status and impaired airway protection. CCM management, appreciate assistance. CT Chest 8/11: Pulmonary parenchyma pattern of interstitial lung disease, questionably due to fibrotic hypersensitive pneumonitis or fibrotic nonspecific interstitial pneumonitis CXR 8/12: Stable multifocal pulmonary infiltrates  Hypertension HFp EF Home meds: None Started on Norvasc  5 mg, losartan  50 mg Beta-blocker as needed to maintain BP goal Blood Pressure Goal: SBP between 130-150 for 24 hours and then less than 160   Hyperlipidemia Home meds: None LDL 47 goal < 70 High intensity statin not indicated due to LDL within goal  DM Home meds: Metformin  A1c: PENDING CBG, SSI  Dysphagia Patient has post-stroke dysphagia, SLP consulted    Diet   Diet NPO time specified   Start tube feeds, nutrition consult  Other Stroke Risk Factors Congestive heart failure  Other Active Problems AKI, Cr 0.91--1.36 Continue strict I/O, daily labs Leukocytosis-WBC 12.5, afebrile Polycythemia-Hgb 19.4, HCT 56   Pt seen by Neuro NP/APP and later by MD. Note/plan to be edited by MD as needed.    Rocky JAYSON Likes, DNP, AGACNP-BC [facility] Please use AMION for contact information & EPIC for messaging.  I have personally obtained history,examined this patient, reviewed notes, independently viewed imaging studies, participated in medical decision making and plan of care.ROS completed by me personally and pertinent positives fully documented  I have made any additions or  clarifications directly to the above note. Agree with note above.  Patient known to stroke service who was transferred to  the floor and we signed off yesterday when unfortunately overnight developed neurological worsening due to obstructive hydrocephalus and early herniation.  Patient received emergent CT head and transferred to ICU and ventriculostomy which appears to be draining well.  Neurological exam however remains poor.  Continue ventriculostomy drainage and ventilatory support.  Strict control of blood pressure systolic goal below 160.  Long discussion at the bedside with patient's multiple family members and answered questions.  Discussed with Dr Theodoro critical care medicine. This patient is critically ill and at significant risk of neurological worsening, death and care requires constant monitoring of vital signs, hemodynamics,respiratory and cardiac monitoring, extensive review of multiple databases, frequent neurological assessment, discussion with family, other specialists and medical decision making of high complexity.I have made any additions or clarifications directly to the above note.This critical care time does not reflect procedure time, or teaching time or supervisory time of PA/NP/Med Resident etc but could involve care discussion time.  I spent 30 minutes of neurocritical care time  in the care of  this patient.     Eather Popp, MD Medical Director [facility] Pager: [phone] 11/25/2023 7:28 PM

## 2023-11-25 NOTE — Progress Notes (Signed)
 Pt transferred to ICU to receiving RN Mindy for ventric drain placement and intubation after head ct showing worsening results per Neurology. At 2300 I found the patient obtunded and with an obvious neuro change which warranteed me to call rapid response who came and saw the pt and also notified neurology  who ordered a stat head CT Scan.. Dr Vanessa notified family of pt's ICU admission.

## 2023-11-25 NOTE — Progress Notes (Signed)
 Patient arrived to 62m08 from 3W via Rapid Response and Deward Eastern, NP. Meds given: 2357-5 cc's Propofol  0000-10 cc's Propofol  0001-5 cc's Propofol  0002- 50 Roc 100 mg Fentanyl  2 mg Versed  Patient intubated at 0004 with 8.0 Ett 24 @ lip by RT and Deward Eastern, NP. Patients vitals at intubation are BP=129/79 (94), HR=123, RR=20, O2 sat=98%. Patient is awaiting transfer to 4N.

## 2023-11-25 NOTE — Progress Notes (Addendum)
 NAME:  Zachary Brown, MRN:  [id number], DOB:  1950-09-10, LOS: 4 ADMISSION DATE:  11/21/2023, CONSULTATION DATE:  11/21/23  REFERRING MD:  Dr. Raford ER, CHIEF COMPLAINT:  ICH   History of Present Illness:  73 year old male with PMH as below who presented to [facility] ED 8/8 after being found on the floor by his daughter. He reports having possibly falling although he does not remember the events surrounding the incident, but feels as though he did not hit his head. He estimates being on the floor for approximately three hours prior to being found by his daughter. ED documentation reports the patient walking and left leg growing weak as well as Left arm numbness prior to fall. . Other areas on injury include the left forearm. Upon arrival to the ED he was noted to have left sided weakness and was immediatly taken for CT of the head. Found to have right sided basal ganglia IPH with intraventricular extension. He was started on nicardipine infusion for blood pressure control . Neurosurgery at [facility] was consulted and recommended transfer for neuro ICU admission. PCCM accepted the patient for transfer.   11/25/23 - patient transferred to the ICU post head CT earlier after acute mental status changes, emergently intubated. Discussed with Neurologist bedside, planning for EVD placement per Neurosurgeon.  Pertinent  Medical History   Past Medical History:  Diagnosis Date   Chronic respiratory failure with hypoxia (HCC)    Heart failure (HCC)    Pneumonia 1956   severe   Snake bite 1980   copper heaed     Significant Hospital Events: Including procedures, antibiotic start and stop dates in addition to other pertinent events     Interim History / Subjective:  8/12- transfer to ICU for airway support and EVD placement  Objective    Blood pressure (!) 155/101, pulse 68, temperature 98 F (36.7 C), temperature source Axillary, resp. rate 18, weight 86.4 kg, SpO2 95%.        Intake/Output  Summary (Last 24 hours) at 11/25/2023 0017 Last data filed at 11/24/2023 1826 Gross per 24 hour  Intake 479.46 ml  Output 500 ml  Net -20.54 ml   Filed Weights   11/22/23 0345 11/23/23 0403  Weight: 80.1 kg 86.4 kg    Examination: General: altered, intubated  HENT: pupil changes, dry mucous membranes  Lungs: bilateral rhonchi, no wheezing Cardiovascular: RRR, no murmur Abdomen: soft, nontender Extremities: no edema, no cyanosis Neuro: altered, minimally responsive   Resolved problem list   Assessment and Plan   Intraparenchymal hemorrhage: right basal ganglia with extension into the right lateral and third ventricles. Concern for hypertensive bleed based on location, but did fall and he has scalp soft tissue injury.  Intraventricular hemorrhage Subarachnoid hemorrhage - Appreciate neurology and neurosurgery management - SBP goal 130-150 - Blood pressure control: Amlodipine  5, losartan  50.  Uptitrate if necessary - Labetalol  available if needed - Has not needed nicardipine - Neuroprotective measures and seizure precautions - MRI brain reviewed  11/24/23 - repeat head CT reviewed, showing slight worsening of ICH with ventricular extension - Neurologist bedside discussed with Neurosurgeon - planning for EVD placement. --> Goal SBP overnight 130-150 systolic. PRN labetalol  and cleviprex as needed to keep SBP within goal.  --> q1h Neuro checks. Appreciate Neuro recs  Acute hypoxic respiratory failure on vent - intubated on 11/25/23 for mental status changes - resume vent bundle/protocol.  Possible ILD: started workup in pulm office 2017 but lost to follow  up.  Acute on chronic respiratory failure with hypoxia -Has not had access to his home oxygen, concentrator has been broken.  Will need to try to reestablish - Once stabilized neurologically needs high-resolution CT chest to evaluate his ILD - Wean O2 as able, former baseline 2 L/min although he has not used the oxygen for an  extended time as it has not been available.   Hx HFpEF ? Syncope Secondary PAH based on echocardiogram -Careful with IV fluids - Suspect that PAH is due to noncompliance with oxygen over the last several years.  Further workup as an outpatient appropriate including screening for VTE, OSA if clinical suspicion, etc.   Elevated bilirubin -RUQ ultrasound not done yet.  Follow bilirubin and consider this hospitalization   DM - Unclear whether he was taking metformin  as per his medication list.  Will attempt to clarify - CBG, sliding scale insulin   Appreciate Consultant recs eLink ICU consult.  Best Practice (right click and Reselect all SmartList Selections daily)   Diet/type: NPO DVT prophylaxis SCD Pressure ulcer(s): N/A GI prophylaxis: H2B Lines: N/A Foley:  Plan to insert Code Status:  full code Last date of multidisciplinary goals of care discussion [11/25/23]  Labs   CBC: Recent Labs  Lab 11/22/23 0609 11/24/23 0418  WBC 12.5* 13.1*  HGB 19.4* 20.1*  HCT 56.6* 58.8*  MCV 92.2 92.5  PLT 141* 156    Basic Metabolic Panel: Recent Labs  Lab 11/22/23 0609 11/24/23 0418  NA 137 138  K 4.2 4.1  CL 103 100  CO2 25 29  GLUCOSE 167* 139*  BUN 14 20  CREATININE 1.02 0.91  CALCIUM 8.8* 9.0  MG 1.8 2.0  PHOS 3.9 3.1   GFR: CrCl cannot be calculated (Unknown ideal weight.). Recent Labs  Lab 11/22/23 0609 11/24/23 0418  WBC 12.5* 13.1*    Liver Function Tests: Recent Labs  Lab 11/22/23 0609  AST 30  ALT 24  ALKPHOS 56  BILITOT 2.4*  PROT 6.5  ALBUMIN 3.4*   No results for input(s): LIPASE, AMYLASE in the last 168 hours. No results for input(s): AMMONIA in the last 168 hours.  ABG    Component Value Date/Time   PHART 7.406 01/21/2016 2141   PCO2ART 90.4 (HH) 01/21/2016 2141   PO2ART 55.9 (L) 01/21/2016 2141   HCO3 55.6 (H) 01/21/2016 2141   O2SAT 88.0 01/21/2016 2141     Coagulation Profile: No results for input(s): INR, PROTIME  in the last 168 hours.  Cardiac Enzymes: No results for input(s): CKTOTAL, CKMB, CKMBINDEX, TROPONINI in the last 168 hours.  HbA1C: Hgb A1c MFr Bld  Date/Time Value Ref Range Status  01/29/2016 02:43 PM 8.0 (H) 4.8 - 5.6 % Final    Comment:    (NOTE)         Pre-diabetes: 5.7 - 6.4         Diabetes: >6.4         Glycemic control for adults with diabetes: <7.0     CBG: Recent Labs  Lab 11/24/23 0835 11/24/23 1203 11/24/23 1523 11/24/23 2013 11/24/23 2307  GLUCAP 193* 194* 165* 151* 158*    Review of Systems:   Unable to obtain: AMS  Past Medical History:  He,  has a past medical history of Chronic respiratory failure with hypoxia (HCC), Heart failure (HCC), Pneumonia (1956), and Snake bite (1980).   Surgical History:  History reviewed. No pertinent surgical history.   Social History:   reports that he quit smoking about  42 years ago. His smoking use included cigarettes. He started smoking about 62 years ago. He has a 20 pack-year smoking history. He has never used smokeless tobacco. He reports that he does not drink alcohol  and does not use drugs.   Family History:  His family history includes Asthma in his sister; Heart attack in his father.   Allergies No Known Allergies   Home Medications  Prior to Admission medications   Medication Sig Start Date End Date Taking? Authorizing Provider  aspirin  EC 81 MG tablet Take 81 mg by mouth 4 (four) times a week.    Yes [provider]  Coenzyme Q10 (COQ-10 PO) Take 1 Capful by mouth daily.   Yes [provider]  Multiple Vitamin (MULTIVITAMIN WITH MINERALS) TABS tablet Take 1 tablet by mouth daily.   Yes [provider]     Critical care time: 60 minutes

## 2023-11-25 NOTE — Significant Event (Signed)
 Rapid Response Event Note   Reason for Call :  Decreased MS  Pt RN, pt was able to answer questions with 1 word answers at shift change.   Initial Focused Assessment:  Pt lying in bed with eyes closed. His breathing is shallow/tachypneic. Pt will withdraw to pain in all extremities but will not open eyes, speak, or follow commands. Pupils 3, equal, and sluggish. Pt with very weak gag reflex. Lungs rhonchus t/o. Skin warm to tough.   T-99.0(Ax), HR-80s, BP-176/89, RR-24, SpO2-95% on 5L [place]  Pt taken to CT scan. After scan, pt reassessed and no gag and pupils no longer reactive. Pt taken emergently to 5M and intubated.   Interventions:  CBG-158 CT Head-obstructive hydrocephalus PCCM consulted: ICU tx Intubated N/S consulted-plan EVD Plan of Care:  Pt transferred to 5M08 and intubated. N/S coming to see pt. Plan EVD, 3% saline, BP control,  and tx to 4NICU when bed available.  Event Summary:   MD Notified: MD Khaliqdina(neuro), MD Patel/NP Hoffman(PCCM), MD Kakrakandy(TRH-notified by neuro MD), Bergman/Jenkins(neurosurgery-notified by neuro MD) Call Time:2302 Arrival Time:2308 End Time:0030  Tish Graeme Piety, RN

## 2023-11-25 NOTE — Progress Notes (Signed)
 eLink Physician-Brief Progress Note Patient Name: Roby Donaway DOB: 04-Jul-1950 MRN: [id number]   Date of Service  11/25/2023  HPI/Events of Note  73 year old male who presented with a new onset of left-sided weakness and found to have ICH with right intraventricular extension.  He became obtunded on the floor and ended up intubated after which an EVD was placed and he has been admitted to the ICU.  eICU Interventions  Patient's chart reviewed.  Pertinent labs and imaging studies reviewed.  Video assessment of patient done.  Intubated and sedated.  RN reports no purposeful movement when sedation is lightened but he gets agitated and dyssynchronous with the vent.  He withdraws all extremities to pain. Impression: ICH with resultant hemiparesis. Obtundation most likely from ICH  Systolic BP goal 869-849.  Will need central access for vasopressors to do that.  Have discussed with critical care APP. Propofol  for sedation to keep him synchronous with the vent. Appreciate neurology and neurosurgical guidance.     Intervention Category Evaluation Type: New Patient Evaluation  Jerilynn Berg 11/25/2023, 3:00 AM

## 2023-11-25 NOTE — Progress Notes (Signed)
 Patient transported from 2M08 to 4N16 on ventilator. No adverse events noted, VSS throughout. Adjusted patient's FiO2 down to 60% after ABG results.

## 2023-11-26 ENCOUNTER — Inpatient Hospital Stay (HOSPITAL_COMMUNITY)

## 2023-11-26 DIAGNOSIS — I609 Nontraumatic subarachnoid hemorrhage, unspecified: Secondary | ICD-10-CM | POA: Diagnosis not present

## 2023-11-26 DIAGNOSIS — R7401 Elevation of levels of liver transaminase levels: Secondary | ICD-10-CM

## 2023-11-26 DIAGNOSIS — G911 Obstructive hydrocephalus: Secondary | ICD-10-CM

## 2023-11-26 DIAGNOSIS — J9621 Acute and chronic respiratory failure with hypoxia: Secondary | ICD-10-CM | POA: Diagnosis not present

## 2023-11-26 DIAGNOSIS — N179 Acute kidney failure, unspecified: Secondary | ICD-10-CM | POA: Diagnosis not present

## 2023-11-26 DIAGNOSIS — R29703 NIHSS score 3: Secondary | ICD-10-CM | POA: Diagnosis not present

## 2023-11-26 DIAGNOSIS — D7589 Other specified diseases of blood and blood-forming organs: Secondary | ICD-10-CM

## 2023-11-26 DIAGNOSIS — I618 Other nontraumatic intracerebral hemorrhage: Secondary | ICD-10-CM | POA: Diagnosis not present

## 2023-11-26 DIAGNOSIS — S06310A Contusion and laceration of right cerebrum without loss of consciousness, initial encounter: Secondary | ICD-10-CM | POA: Diagnosis not present

## 2023-11-26 DIAGNOSIS — I615 Nontraumatic intracerebral hemorrhage, intraventricular: Secondary | ICD-10-CM | POA: Diagnosis not present

## 2023-11-26 LAB — COMPREHENSIVE METABOLIC PANEL WITH GFR
ALT: 62 U/L — ABNORMAL HIGH (ref 0–44)
AST: 50 U/L — ABNORMAL HIGH (ref 15–41)
Albumin: 2.9 g/dL — ABNORMAL LOW (ref 3.5–5.0)
Alkaline Phosphatase: 48 U/L (ref 38–126)
Anion gap: 9 (ref 5–15)
BUN: 34 mg/dL — ABNORMAL HIGH (ref 8–23)
CO2: 26 mmol/L (ref 22–32)
Calcium: 9 mg/dL (ref 8.9–10.3)
Chloride: 110 mmol/L (ref 98–111)
Creatinine, Ser: 1.04 mg/dL (ref 0.61–1.24)
GFR, Estimated: >60 mL/min (ref >60)
Glucose, Bld: 199 mg/dL — ABNORMAL HIGH (ref 70–99)
Potassium: 3.8 mmol/L (ref 3.5–5.1)
Sodium: 145 mmol/L (ref 135–145)
Total Bilirubin: 3.1 mg/dL — ABNORMAL HIGH (ref 0.0–1.2)
Total Protein: 5.8 g/dL — ABNORMAL LOW (ref 6.5–8.1)

## 2023-11-26 LAB — GLUCOSE, CAPILLARY
Glucose-Capillary: 181 mg/dL — ABNORMAL HIGH (ref 70–99)
Glucose-Capillary: 183 mg/dL — ABNORMAL HIGH (ref 70–99)
Glucose-Capillary: 241 mg/dL — ABNORMAL HIGH (ref 70–99)
Glucose-Capillary: 161 mg/dL — ABNORMAL HIGH (ref 70–99)
Glucose-Capillary: 248 mg/dL — ABNORMAL HIGH (ref 70–99)
Glucose-Capillary: 234 mg/dL — ABNORMAL HIGH (ref 70–99)
Glucose-Capillary: 257 mg/dL — ABNORMAL HIGH (ref 70–99)

## 2023-11-26 LAB — CBC
HCT: 58.7 % — ABNORMAL HIGH (ref 39.0–52.0)
Hemoglobin: 19.7 g/dL — ABNORMAL HIGH (ref 13.0–17.0)
MCH: 32 pg (ref 26.0–34.0)
MCHC: 33.6 g/dL (ref 30.0–36.0)
MCV: 95.3 fL (ref 80.0–100.0)
Platelets: 130 K/uL — ABNORMAL LOW (ref 150–400)
RBC: 6.16 MIL/uL — ABNORMAL HIGH (ref 4.22–5.81)
RDW: 13.9 % (ref 11.5–15.5)
WBC: 11.5 K/uL — ABNORMAL HIGH (ref 4.0–10.5)
nRBC: 0 % (ref 0.0–0.2)

## 2023-11-26 LAB — POCT I-STAT 7, (LYTES, BLD GAS, ICA,H+H)
Acid-Base Excess: 2 mmol/L (ref 0.0–2.0)
Bicarbonate: 26.7 mmol/L (ref 20.0–28.0)
Calcium, Ion: 1.25 mmol/L (ref 1.15–1.40)
HCT: 55 % — ABNORMAL HIGH (ref 39.0–52.0)
Hemoglobin: 18.7 g/dL — ABNORMAL HIGH (ref 13.0–17.0)
O2 Saturation: 88 %
Patient temperature: 99.7
Potassium: 4.4 mmol/L (ref 3.5–5.1)
Sodium: 148 mmol/L — ABNORMAL HIGH (ref 135–145)
TCO2: 28 mmol/L (ref 22–32)
pCO2 arterial: 41.6 mmHg (ref 32–48)
pH, Arterial: 7.419 (ref 7.35–7.45)
pO2, Arterial: 55 mmHg — ABNORMAL LOW (ref 83–108)

## 2023-11-26 LAB — HEPATITIS A ANTIBODY, IGM: Hep A IgM: NONREACTIVE

## 2023-11-26 LAB — HEPATITIS B SURFACE ANTIGEN: Hepatitis B Surface Ag: NONREACTIVE

## 2023-11-26 LAB — HEPATITIS C ANTIBODY: HCV Ab: NONREACTIVE

## 2023-11-26 LAB — PROCALCITONIN: Procalcitonin: <0.1 ng/mL

## 2023-11-26 LAB — PHOSPHORUS: Phosphorus: 2.4 mg/dL — ABNORMAL LOW (ref 2.5–4.6)

## 2023-11-26 LAB — MAGNESIUM: Magnesium: 2.1 mg/dL (ref 1.7–2.4)

## 2023-11-26 LAB — TRIGLYCERIDES: Triglycerides: 27 mg/dL (ref <150)

## 2023-11-26 LAB — MRSA NEXT GEN BY PCR, NASAL: MRSA by PCR Next Gen: NOT DETECTED

## 2023-11-26 MED ORDER — ENOXAPARIN SODIUM 40 MG/0.4ML IJ SOSY
40.0000 mg | PREFILLED_SYRINGE | Freq: Every day | INTRAMUSCULAR | Status: DC
  Administered 2023-11-26 – 2023-11-28 (×4): 40 mg via SUBCUTANEOUS
  Filled 2023-11-26 (×3): qty 0.4

## 2023-11-26 MED ORDER — PIPERACILLIN-TAZOBACTAM 3.375 G IVPB
3.3750 g | Freq: Three times a day (TID) | INTRAVENOUS | Status: DC
  Administered 2023-11-26 – 2023-11-29 (×11): 3.375 g via INTRAVENOUS
  Filled 2023-11-26 (×9): qty 50

## 2023-11-26 MED ORDER — INSULIN ASPART 100 UNIT/ML IJ SOLN
4.0000 [IU] | INTRAMUSCULAR | Status: DC
  Administered 2023-11-26 – 2023-11-27 (×12): 4 [IU] via SUBCUTANEOUS

## 2023-11-26 MED ORDER — FENTANYL CITRATE PF 50 MCG/ML IJ SOSY
25.0000 ug | PREFILLED_SYRINGE | INTRAMUSCULAR | Status: DC | PRN
  Administered 2023-11-26 (×2): 50 ug via INTRAVENOUS
  Filled 2023-11-26: qty 1

## 2023-11-26 MED ORDER — FAMOTIDINE 20 MG PO TABS
20.0000 mg | ORAL_TABLET | Freq: Two times a day (BID) | ORAL | Status: DC
  Administered 2023-11-26 – 2023-11-28 (×6): 20 mg
  Filled 2023-11-26 (×5): qty 1

## 2023-11-26 MED ORDER — FENTANYL CITRATE PF 50 MCG/ML IJ SOSY
25.0000 ug | PREFILLED_SYRINGE | Freq: Once | INTRAMUSCULAR | Status: AC
  Administered 2023-11-26 (×2): 25 ug via INTRAVENOUS

## 2023-11-26 MED ORDER — FENTANYL CITRATE PF 50 MCG/ML IJ SOSY
PREFILLED_SYRINGE | INTRAMUSCULAR | Status: AC
  Filled 2023-11-26: qty 1

## 2023-11-26 MED ORDER — DEXMEDETOMIDINE HCL IN NACL 400 MCG/100ML IV SOLN
0.0000 ug/kg/h | INTRAVENOUS | Status: DC
  Administered 2023-11-26 – 2023-11-27 (×4): 0.4 ug/kg/h via INTRAVENOUS
  Administered 2023-11-28: 0.3 ug/kg/h via INTRAVENOUS
  Filled 2023-11-26 (×4): qty 100

## 2023-11-26 MED ORDER — FENTANYL 2500MCG IN NS 250ML (10MCG/ML) PREMIX INFUSION
0.0000 ug/h | INTRAVENOUS | Status: DC
  Administered 2023-11-26 (×2): 25 ug/h via INTRAVENOUS
  Filled 2023-11-26: qty 250

## 2023-11-26 MED ORDER — AMLODIPINE BESYLATE 5 MG PO TABS
5.0000 mg | ORAL_TABLET | Freq: Once | ORAL | Status: AC
  Administered 2023-11-26 (×2): 5 mg
  Filled 2023-11-26: qty 1

## 2023-11-26 MED ORDER — AMLODIPINE BESYLATE 10 MG PO TABS
10.0000 mg | ORAL_TABLET | Freq: Every day | ORAL | Status: DC
  Administered 2023-11-27 – 2023-11-28 (×2): 10 mg
  Filled 2023-11-26 (×2): qty 1

## 2023-11-26 MED ORDER — FENTANYL BOLUS VIA INFUSION
25.0000 ug | INTRAVENOUS | Status: DC | PRN
  Administered 2023-11-26 – 2023-11-27 (×3): 50 ug via INTRAVENOUS

## 2023-11-26 MED ORDER — SODIUM CHLORIDE 3 % IN NEBU
4.0000 mL | INHALATION_SOLUTION | Freq: Two times a day (BID) | RESPIRATORY_TRACT | Status: DC
  Administered 2023-11-26 – 2023-11-27 (×3): 4 mL via RESPIRATORY_TRACT
  Filled 2023-11-26 (×2): qty 4

## 2023-11-26 MED ORDER — SODIUM CHLORIDE 0.9 % IV SOLN
20.0000 mmol | Freq: Once | INTRAVENOUS | Status: AC
  Administered 2023-11-26 (×2): 20 mmol via INTRAVENOUS
  Filled 2023-11-26: qty 6.67

## 2023-11-26 MED ORDER — FUROSEMIDE 10 MG/ML IJ SOLN: 40.0000 mg | Freq: Once | INTRAMUSCULAR | Status: DC

## 2023-11-26 MED ORDER — FUROSEMIDE 10 MG/ML IJ SOLN
40.0000 mg | Freq: Once | INTRAMUSCULAR | Status: AC
  Administered 2023-11-26 (×2): 40 mg via INTRAVENOUS
  Filled 2023-11-26: qty 4

## 2023-11-26 NOTE — Procedures (Signed)
 Cortrak  Person Inserting Tube:  Zachary Brown, Zachary Brown, RD Tube Type:  Cortrak - 43 inches Tube Size:  10 Tube Location:  Left nare Initial Placement:  Stomach Secured by: Bridle Technique Used to Measure Tube Placement:  Marking at nare/corner of mouth Cortrak Secured At:  75 cm   Cortrak Tube Team Note:  Consult received to place a Cortrak feeding tube.   No x-ray is required. RN may begin using tube.   If the tube becomes dislodged please keep the tube and contact the Cortrak team at [email or web address] for replacement.  If after hours and replacement cannot be delayed, place a NG tube and confirm placement with an abdominal x-ray.    Zachary Brown, RD Registered Dietitian  See Amion for more information

## 2023-11-26 NOTE — Plan of Care (Signed)

## 2023-11-26 NOTE — Progress Notes (Signed)
   11/26/23 1015  Spiritual Encounters  Type of Visit Initial  Care provided to: [facility] partners present during encounter Nurse  Reason for visit Advance directives  OnCall Visit No    Chaplain responded to AD spiritual consult. Paperwork and information provided to pt Amoni's brother and sister-in-law. I explained that we would not be able to complete the documents at this time because Dantre is unable to consent. Their current desire is to have daughter serve as HCPOA.

## 2023-11-26 NOTE — Progress Notes (Signed)
 Physical Therapy Treatment Patient Details Name: Zachary Brown MRN: [id number] DOB: 1950-08-27 Today's Date: 11/26/2023   History of Present Illness Pt is a 73 y.o. male who presented 11/21/23 s/p fall and L-sided weakness. CT showed a hemorrhage in the R thalamus and intraventricular hemorrhage (R>L), SAH bilaterally, most prominent over the L parietal lobe, R frontal and perculum, and in the occipital lobes bilaterally. S/p EVD placement on 8/12, intubated. PMH: heart failure, snake bite 1980, COPD    PT Comments  Pt remains intubated but not on sedation. Pt starting to follow simple commands and opens eyes to name but doesn't maintain eyes open. Pt maxAx2 for transfer to EOB, maxA to maintain EOB sitting balance. Focused on EOB sitting balance and simple command follow. Pt with noted onset of fatigue and less interaction s/p 15 min of session requiring max verbal and tactile cues to attempt to keep patient engaged. Pt to strongly benefit from intensive inpatient rehab program > 3 hrs a day to maximize functional return. Acute PT to cont to follow.    If plan is discharge home, recommend the following: Two people to help with walking and/or transfers;Two people to help with bathing/dressing/bathroom;Assistance with cooking/housework;Direct supervision/assist for medications management;Direct supervision/assist for financial management;Assist for transportation;Help with stairs or ramp for entrance;Supervision due to cognitive status   Can travel by private vehicle        Equipment Recommendations  Other (comment) (TBD)    Recommendations for Other Services Rehab consult;Speech consult     Precautions / Restrictions Precautions Precautions: Fall;Other (comment) Recall of Precautions/Restrictions: Impaired Precaution/Restrictions Comments: SBP < 160 goal; pushes to L, ETT, cortrak, EVD Restrictions Weight Bearing Restrictions Per Provider Order: No     Mobility  Bed Mobility Overal bed  mobility: Needs Assistance Bed Mobility: Supine to Sit, Sit to Supine   Sidelying to sit: Max assist, +2 for physical assistance   Sit to supine: Max assist, +2 for physical assistance   General bed mobility comments: step by step cueing for technique, maxA for LE management and trunk elevation, RN held/managed ventric drain, vent, and IV    Transfers                   General transfer comment: deferred due to lethargy    Ambulation/Gait               General Gait Details: deferred due to lethargy   Stairs             Wheelchair Mobility     Tilt Bed    Modified Rankin (Stroke Patients Only) Modified Rankin (Stroke Patients Only) Pre-Morbid Rankin Score: No symptoms Modified Rankin: Severe disability     Balance Overall balance assessment: Needs assistance Sitting-balance support: No upper extremity supported, Feet supported Sitting balance-Leahy Scale: Poor Sitting balance - Comments: pushes to L with RUE, posterior lean Postural control: Posterior lean, Left lateral lean     Standing balance comment: deferred due to BP remaining elevated with SBP > 160 when sitting EOB, returned pt to supine instead with SBP improving and RN aware with plans to get pt some BP meds                            Communication Communication Communication: Impaired Factors Affecting Communication: Trach/intubated  Cognition Arousal: Alert Behavior During Therapy: Flat affect   PT - Cognitive impairments: Memory, Attention, Awareness, Sequencing, Problem solving, Safety/Judgement  PT - Cognition Comments: Pt lethargic, often needing stimulation to remain awake when supine. would open eyes to name but not maintain eyes open. Pt with command following about  25% of time, pt reaching and pulling at purwick with R UE. not active movement of L UE Following commands: Impaired Following commands impaired: Follows one step  commands with increased time    Cueing Cueing Techniques: Verbal cues, Tactile cues, Visual cues, Gestural cues  Exercises Other Exercises Other Exercises: worked on sitting balance and cervical extension    General Comments General comments (skin integrity, edema, etc.): VSS      Pertinent Vitals/Pain Pain Assessment Pain Assessment: Faces Faces Pain Scale: Hurts a little bit Pain Location: generalized with LUE nailbed pressure Pain Descriptors / Indicators: Discomfort Pain Intervention(s): Monitored during session    Home Living                          Prior Function            PT Goals (current goals can now be found in the care plan section) Acute Rehab PT Goals Patient Stated Goal: to return to being independent PT Goal Formulation: With patient/family Time For Goal Achievement: 12/07/23 Potential to Achieve Goals: Good Progress towards PT goals: Progressing toward goals    Frequency    Min 3X/week      PT Plan      Co-evaluation PT/OT/SLP Co-Evaluation/Treatment: Yes Reason for Co-Treatment: Complexity of the patient's impairments (multi-system involvement);For patient/therapist safety;To address functional/ADL transfers PT goals addressed during session: Mobility/safety with mobility OT goals addressed during session: ADL's and self-care      AM-PAC PT 6 Clicks Mobility   Outcome Measure  Help needed turning from your back to your side while in a flat bed without using bedrails?: A Lot Help needed moving from lying on your back to sitting on the side of a flat bed without using bedrails?: A Lot Help needed moving to and from a bed to a chair (including a wheelchair)?: Total Help needed standing up from a chair using your arms (e.g., wheelchair or bedside chair)?: Total Help needed to walk in hospital room?: Total Help needed climbing 3-5 steps with a railing? : Total 6 Click Score: 8    End of Session Equipment Utilized During  Treatment: Oxygen (vent) Activity Tolerance: Other (comment);Patient limited by fatigue Patient left: in bed;with call bell/phone within reach;with bed alarm set;with nursing/sitter in room Nurse Communication: Mobility status (RN present to assist with lines) PT Visit Diagnosis: Muscle weakness (generalized) (M62.81);Difficulty in walking, not elsewhere classified (R26.2);Other symptoms and signs involving the nervous system (R29.898);Hemiplegia and hemiparesis Hemiplegia - Right/Left: Left Hemiplegia - dominant/non-dominant: Dominant Hemiplegia - caused by: Nontraumatic intracerebral hemorrhage     Time: 0801-0825 PT Time Calculation (min) (ACUTE ONLY): 24 min  Charges:    $Neuromuscular Re-education: 8-22 mins PT General Charges $$ ACUTE PT VISIT: 1 Visit                     Norene Ames, PT, DPT Acute Rehabilitation Services Secure chat preferred Office #: [phone]    Norene CHRISTELLA Ames 11/26/2023, 11:24 AM

## 2023-11-26 NOTE — TOC Progression Note (Signed)
 Transition of Care [facility]) - Progression Note    Patient Details  Name: Zachary Brown MRN: [id number] Date of Birth: Oct 13, 1950  Transition of [facility]) CM/SW Contact  Inocente GORMAN Kindle, LCSW Phone Number: 11/26/2023, 9:40 AM  Clinical Narrative:    ICM continuing to follow. Patient remains on vent with Cortrak.    Expected Discharge Plan: IP Rehab Facility                 Expected Discharge Plan and Services                                               Social Drivers of Health (SDOH) Interventions SDOH Screenings   Food Insecurity: Unknown (11/21/2023)  Housing: Unknown (11/21/2023)  Transportation Needs: Unknown (11/21/2023)  Utilities: Not At Risk (11/21/2023)  Social Connections: Unknown (11/21/2023)  Tobacco Use: Medium Risk (11/21/2023)    Readmission Risk Interventions     No data to display

## 2023-11-26 NOTE — Inpatient Diabetes Management (Signed)
 Inpatient Diabetes Program Recommendations  AACE/ADA: New Consensus Statement on Inpatient Glycemic Control (2015)  Target Ranges:  Prepandial:   less than 140 mg/dL      Peak postprandial:   less than 180 mg/dL (1-2 hours)      Critically ill patients:  140 - 180 mg/dL   Lab Results  Component Value Date   GLUCAP 161 (H) 11/26/2023   HGBA1C 8.0 (H) 01/29/2016    Might consider redrawing A1C as it is still in process from 11/21/23.  Thank you, Wyvonna Pinal, MSN, CDCES Diabetes Coordinator Inpatient Diabetes Program [phone] (team pager from 8a-5p)

## 2023-11-26 NOTE — Progress Notes (Addendum)
 Occupational Therapy Treatment Patient Details Name: Zachary Brown MRN: [id number] DOB: 07-07-1950 Today's Date: 11/26/2023   History of present illness Pt is a 73 y.o. male who presented 11/21/23 s/p fall and L-sided weakness. CT showed a hemorrhage in the R thalamus and intraventricular hemorrhage (R>L), SAH bilaterally, most prominent over the L parietal lobe, R frontal and perculum, and in the occipital lobes bilaterally. S/p EVD placement on 8/12, intubated. PMH: heart failure, snake bite 1980, COPD   OT comments  Pt seen s/p EVD and intubated, EVD clamped by RN prior to session. Pt needing max +2 for bed mobility and pushes to L with RUE once sitting. Pt with no AROM of LUE this session, PROM WFL. Pt briefly opening eyes and turns head to L with cues, but primarily with downward gaze and R gaze when eyelids manually lifted. Pt following minimal commands with RUE. Pt presenting with impairments listed below, will follow acutely. Patient will benefit from intensive inpatient follow-up therapy, >3 hours/day to maximize safety/ind with ADL/functional mobility.  SpO2 95% 40% FiO2 vent 5 PEEP BP 141/75 (94) HR 75 bpm       If plan is discharge home, recommend the following:  Two people to help with walking and/or transfers;A lot of help with bathing/dressing/bathroom;Assistance with cooking/housework;Direct supervision/assist for medications management;Direct supervision/assist for financial management;Assist for transportation;Help with stairs or ramp for entrance   Equipment Recommendations  Other (comment) (defer)    Recommendations for Other Services Rehab consult    Precautions / Restrictions Precautions Precautions: Fall;Other (comment) Recall of Precautions/Restrictions: Impaired Precaution/Restrictions Comments: SBP < 160 goal; pushes to L, ETT, cortrak, EVD Restrictions Weight Bearing Restrictions Per Provider Order: No       Mobility Bed Mobility Overal bed mobility:  Needs Assistance Bed Mobility: Rolling, Sidelying to Sit, Sit to Sidelying   Sidelying to sit: Max assist, +2 for physical assistance     Sit to sidelying: Max assist, +2 for physical assistance      Transfers                   General transfer comment: deferred     Balance Overall balance assessment: Needs assistance Sitting-balance support: No upper extremity supported, Feet supported Sitting balance-Leahy Scale: Poor Sitting balance - Comments: pushes to L with RUE, posterior lean Postural control: Posterior lean, Left lateral lean                                 ADL either performed or assessed with clinical judgement   ADL Overall ADL's : Needs assistance/impaired                                     Functional mobility during ADLs: Maximal assistance;+2 for physical assistance      Extremity/Trunk Assessment Upper Extremity Assessment Upper Extremity Assessment: LUE deficits/detail LUE Deficits / Details: no AROM noted this session, PROM WFL LUE Sensation: decreased light touch;decreased proprioception LUE Coordination: decreased fine motor;decreased gross motor   Lower Extremity Assessment Lower Extremity Assessment: Defer to PT evaluation        Vision   Vision Assessment?: Yes;Vision impaired- to be further tested in functional context Additional Comments: pt keeping eyes closed most of session, gaze downward when cued to open eyes, gaze deviating to R when eyelids manually lifted   Perception Perception Perception: Impaired Preception  Impairment Details: Inattention/Neglect Perception-Other Comments: L inattention v neglect   Praxis Praxis Praxis: Impaired Praxis Impairment Details: Motor planning   Communication Communication Communication: No apparent difficulties   Cognition Arousal: Alert Behavior During Therapy: Flat affect Cognition: Cognition impaired     Awareness: Intellectual awareness  impaired Memory impairment (select all impairments): Working [employer] fast streamer, Short-term memory Attention impairment (select first level of impairment): Selective attention Executive functioning impairment (select all impairments): Problem solving, Sequencing, Initiation OT - Cognition Comments: <10% command follow during session                 Following commands: Impaired Following commands impaired: Follows one step commands with increased time      Cueing   Cueing Techniques: Verbal cues, Tactile cues, Visual cues, Gestural cues  Exercises      Shoulder Instructions       General Comments VSS    Pertinent Vitals/ Pain       Pain Assessment Pain Assessment: Faces Pain Score: 2  Faces Pain Scale: Hurts a little bit Pain Location: generalized with LUE nailbed pressure Pain Descriptors / Indicators: Discomfort Pain Intervention(s): Limited activity within patient's tolerance, Monitored during session, Repositioned  Home Living                                          Prior Functioning/Environment              Frequency  Min 2X/week        Progress Toward Goals  OT Goals(current goals can now be found in the care plan section)  Progress towards OT goals: Progressing toward goals  Acute Rehab OT Goals Patient Stated Goal: none stated OT Goal Formulation: With patient Time For Goal Achievement: 12/08/23 Potential to Achieve Goals: Good ADL Goals Pt Will Perform Grooming: with contact guard assist;sitting Pt Will Perform Upper Body Bathing: sitting;with contact guard assist Pt Will Perform Lower Body Dressing: with mod assist;sitting/lateral leans;sit to/from stand Pt Will Transfer to Toilet: with mod assist;bedside commode;with +2 assist Pt/caregiver will Perform Home Exercise Program: Increased strength;Left upper extremity;With written HEP provided Additional ADL Goal #1: Pt will complete bed mobility with min assist and maintain sitting  balance dynamically with min assist for 5 minutes.  Plan      Co-evaluation    PT/OT/SLP Co-Evaluation/Treatment: Yes Reason for Co-Treatment: Complexity of the patient's impairments (multi-system involvement);For patient/therapist safety;To address functional/ADL transfers PT goals addressed during session: Mobility/safety with mobility OT goals addressed during session: ADL's and self-care      AM-PAC OT 6 Clicks Daily Activity     Outcome Measure   Help from another person eating meals?: Total Help from another person taking care of personal grooming?: A Lot Help from another person toileting, which includes using toliet, bedpan, or urinal?: Total Help from another person bathing (including washing, rinsing, drying)?: Total Help from another person to put on and taking off regular upper body clothing?: A Lot Help from another person to put on and taking off regular lower body clothing?: Total 6 Click Score: 8    End of Session Equipment Utilized During Treatment: Oxygen  OT Visit Diagnosis: Other abnormalities of gait and mobility (R26.89);Muscle weakness (generalized) (M62.81);Hemiplegia and hemiparesis;Other symptoms and signs involving cognitive function Hemiplegia - Right/Left: Left Hemiplegia - dominant/non-dominant: Non-Dominant Hemiplegia - caused by: Nontraumatic intracerebral hemorrhage   Activity Tolerance Patient limited by fatigue  Patient Left in bed;with call bell/phone within reach;with bed alarm set;with nursing/sitter in room;with restraints reapplied   Nurse Communication Mobility status (ok to work with pt EOB)        Time: 9198-9174 OT Time Calculation (min): 24 min  Charges: OT General Charges $OT Visit: 1 Visit OT Treatments $Therapeutic Activity: 8-22 mins  Ulys Favia K, OTD, OTR/L SecureChat Preferred Acute Rehab [phone]   Laneta POUR Koonce 11/26/2023, 8:36 AM

## 2023-11-26 NOTE — Progress Notes (Signed)
 NAME:  Zachary Brown, MRN:  [id number], DOB:  09-Sep-1950, LOS: 5 ADMISSION DATE:  11/21/2023, CONSULTATION DATE:  11/21/23  REFERRING MD:  Dr. Raford ER, CHIEF COMPLAINT:  ICH   History of Present Illness:  73 year old male with PMH as below who presented to [facility] ED 8/8 after being found on the floor by his daughter. He reports having possibly falling although he does not remember the events surrounding the incident, but feels as though he did not hit his head. He estimates being on the floor for approximately three hours prior to being found by his daughter. ED documentation reports the patient walking and left leg growing weak as well as Left arm numbness prior to fall. . Other areas on injury include the left forearm. Upon arrival to the ED he was noted to have left sided weakness and was immediatly taken for CT of the head. Found to have right sided basal ganglia IPH with intraventricular extension. He was started on nicardipine infusion for blood pressure control . [facility]surgery at [facility] was consulted and recommended transfer for neuro ICU admission. PCCM accepted the patient for transfer.   11/25/23 - patient transferred to the ICU post head CT earlier after acute mental status changes, emergently intubated. Discussed with Neurologist bedside, planning for EVD placement per Neurosurgeon.  Pertinent  Medical History   Past Medical History:  Diagnosis Date   Chronic respiratory failure with hypoxia (HCC)    Heart failure (HCC)    Pneumonia 1956   severe   Snake bite 1980   copper heaed     Significant Hospital Events: Including procedures, antibiotic start and stop dates in addition to other pertinent events   8/12- transfer to ICU for airway support and EVD placement 8/13- Intermittent command following, weaning on vent, will plan to extubate after cortrak placment   Interim History / Subjective:  Intermittent command following  EVD at 61ml/hr- minimal output   Objective     Blood pressure (!) 141/75, pulse 75, temperature 99.7 F (37.6 C), temperature source Axillary, resp. rate 18, height 5' 6 (1.676 m), weight 81.8 kg, SpO2 96%.    Vent Mode: PSV;CPAP FiO2 (%):  [40 %] 40 % Set Rate:  [18 bmp-26 bmp] 18 bmp Vt Set:  [510 mL] 510 mL PEEP:  [5 cmH20] 5 cmH20 Pressure Support:  [5 cmH20] 5 cmH20 Plateau Pressure:  [10 cmH20-16 cmH20] 14 cmH20   Intake/Output Summary (Last 24 hours) at 11/26/2023 0825 Last data filed at 11/26/2023 0800 Gross per 24 hour  Intake 1347.43 ml  Output 1302 ml  Net 45.43 ml   Filed Weights   11/23/23 0403 11/25/23 0702 11/26/23 0500  Weight: 86.4 kg 83.7 kg 81.8 kg    Examination: General: acute on chronic older adult male, lying in icu bed on vent in NAD HEENT: Normocephalic, EVD at 89fo/ym PERRLA intact, ETT, OG, missing teeth, poor dentition  Pink MM CV: s1,s2, RRR, no MRG, No JVD  pulm: clear, diminished, no distress on vent  Abs: bs active, soft  Extremities: no edema, no deformity, moves all extremities to painful stimuli  Skin: no rash  Neuro: Rass -1 to 1, responds to painful stimuli, cough reflex present, gag diminished  GU: deferred   Resolved problem list   Assessment and Plan  Intraparenchymal hemorrhage: right basal ganglia with extension into the right lateral and third ventricles. Concern for hypertensive bleed based on location, but did fall and he has scalp soft tissue injury.  Intraventricular hemorrhage Subarachnoid hemorrhage 11/24/23 - repeat head CT reviewed, showing slight worsening of ICH with ventricular extension - Neurologist bedside discussed with Neurosurgeon - planning for EVD placement. EVD placement at 24ml/hr- minimal serosanguineous output  8/12 repeat scan nterval placement of a left frontal approach EVD with tip in the left lateral ventricle. Persistent hydrocephalus, slightly improved. Relatively stable size intraparenchymal hemorrhage centered at the superior right thalamus.  Surrounding vasogenic edema with trace localized right-to-left shift, stable.Scattered subarachnoid hemorrhage involving both cerebralhemispheres, overall slightly decreased. No other new acute intracranial abnormality. P: SBP goal 130-150 for 24hrs per neurology, then can advance to <160 Off of levophed  gtt, will discontinue order, also continues to be off sedation  Continue norvasc , labetalol /hydralazine  prn to meet SBP goal  Continue neuroprotective measures- euvolemia, euglycemia, normoxia, normocapnia, maintain electrolytes within normal ranges  Continue post-op neurosurg management, continue to evaluate EVD drainage/site    Acute on chronic hypoxic respiratory failure requiring vent for airway protection secondary to above  - intubated on 11/25/23 for mental status changes - resume vent bundle/protocol. Possible ILD: started workup in pulm office 2017 but lost to follow up.  Has not had access to his home oxygen, concentrator has been broken.  - - former baseline 2 L/min although he has not used the oxygen for an extended time as it has not been available. Ct Chest high resolution on 8/11- Pulmonary parenchymal pattern of interstitial lung disease, as detailed above, may be due to fibrotic hypersensitivity pneumonitis or fibrotic nonspecific interstitial pneumonitis.  P:  Continue ventilator support and lung protective strategies  Continue LTVV  Wean PEEP and Fio2 requirements to sat goal of >90%  HOB > 30 degrees Plat < 30  Aim for Driving pressures < 15  Intermittent Chest X-ray and ABGS VAP and PAD protocols in place  Continue to remain off sedation, beginning to follow commands- will plan to extubate after placement of cortrak    AKI- improving  Hypophosphatemia- Phos 2.4 on 8/13  0.91>1.36> 1.04  Possibly in setting of hypotension overnight and from IPH as above P: Continue to trend renal function daily  Continue to monitor and optimize electrolytes daily Continue to monitor  urine output Continue strict I/Os Continue Adequate renal perfusion  Avoid nephrotoxic agents Replace phos, pharmacy ordering    Hx HFpEF 11/22/23- EF 60 to 65%  Regional wall abnormalities, grade 1 diastolic dysfunction, RV mildy dilated, RV mildly reduced  ? Syncope Secondary PAH based on echocardiogram Suspect that PAH is due to noncompliance with oxygen over the last several years.  Further workup as an outpatient appropriate including screening for VTE, OSA if clinical suspicion, etc. P: Continue Cardiac tele  Continue to evaluate fluid volume status  Off levophed  gtt   Elevated bilirubin Mild elevation in LFTS- AST 50, ALT 62  8/8 US  Limited RUQ- no gall stones or wall thickening, normal US   Total bili > 3.8 > 3.1  P: Continue to trend LFTS intermittently  Avoid hepatotoxic agents  Continue to follow bili  Check for hepatitis A, B, C - with HAV-IgM, HBsAg, HBcIgM, HCV antibody, HCV RNA   Erythrocytosis  Elevated hemoglobin  Leukocytosis  Per discussion with daughter, oxygen runs between mid 80s to low 90s, not sure how often this is measured, and when this has been evaluated during periods of exertion vs resting  Patient has not been wearing oxygen for sometime per report, suspect this is related to chronic hypoxemia but cannot fully rule out primary erythrocytosis  Hgb 21.3>>  20.2>>19.0>19.1 WBC trending down- 18.4>13.0> 11.5  P: Currently hemoglobin trending down If continues to be elevated, consider getting Heme/Onc consult once medically stable  Continue to trend CBC daily to evaluate h/h, WBC    DM - Unclear whether he was taking metformin  as per his medication list. CBGs not at goal - last 2 checks 241, 183  P: Continue to hold metformin   Continue CBG checks q4, continue SSI- moderate scale  Continue long acting semeglee  Will add tube feed coverage  Place cortrak    Best Practice (right click and Reselect all SmartList Selections daily)   Diet/type:  NPO- place cortrak  DVT prophylaxis SCD Pressure ulcer(s): N/A GI prophylaxis: H2B Lines: N/A Foley:  Plan to insert Code Status:  full code Last date of multidisciplinary goals of care discussion [11/26/23]  Labs   CBC: Recent Labs  Lab 11/22/23 0609 11/24/23 0418 11/25/23 0136 11/25/23 0658 11/25/23 1128 11/25/23 1327 11/26/23 0525  WBC 12.5* 13.1*  --  18.4* 13.0*  --  11.5*  HGB 19.4* 20.1* 19.7* 21.3* 20.2* 19.0* 19.7*  HCT 56.6* 58.8* 58.0* 62.8* 59.0* 56.0* 58.7*  MCV 92.2 92.5  --  93.7 93.4  --  95.3  PLT 141* 156  --  211 135*  --  130*    Basic Metabolic Panel: Recent Labs  Lab 11/22/23 0609 11/24/23 0418 11/25/23 0045 11/25/23 0136 11/25/23 0658 11/25/23 1128 11/25/23 1327 11/26/23 0525  NA 137 138   < > 143 141 142 144 145  K 4.2 4.1  --  3.9 4.0  --  3.7 3.8  CL 103 100  --   --  107  --   --  110  CO2 25 29  --   --  21*  --   --  26  GLUCOSE 167* 139*  --   --  176*  --   --  199*  BUN 14 20  --   --  36*  --   --  34*  CREATININE 1.02 0.91  --   --  1.36*  --   --  1.04  CALCIUM 8.8* 9.0  --   --  9.0  --   --  9.0  MG 1.8 2.0  --   --  2.1  --   --  2.1  PHOS 3.9 3.1  --   --  2.7  --   --  2.4*   < > = values in this interval not displayed.   GFR: Estimated Creatinine Clearance: 65.4 mL/min (by C-G formula based on SCr of 1.04 mg/dL). Recent Labs  Lab 11/24/23 0418 11/25/23 0658 11/25/23 1128 11/26/23 0525  WBC 13.1* 18.4* 13.0* 11.5*    Liver Function Tests: Recent Labs  Lab 11/22/23 0609 11/25/23 0658 11/26/23 0525  AST 30 22 50*  ALT 24 23 62*  ALKPHOS 56 59 48  BILITOT 2.4* 3.8* 3.1*  PROT 6.5 6.7 5.8*  ALBUMIN 3.4* 3.6 2.9*   No results for input(s): LIPASE, AMYLASE in the last 168 hours. No results for input(s): AMMONIA in the last 168 hours.  ABG    Component Value Date/Time   PHART 7.396 11/25/2023 1327   PCO2ART 40.5 11/25/2023 1327   PO2ART 112 (H) 11/25/2023 1327   HCO3 24.7 11/25/2023 1327   TCO2  26 11/25/2023 1327   ACIDBASEDEF 1.0 11/25/2023 0136   O2SAT 98 11/25/2023 1327     Coagulation Profile: No results for input(s): INR, PROTIME in the last 168  hours.  Cardiac Enzymes: No results for input(s): CKTOTAL, CKMB, CKMBINDEX, TROPONINI in the last 168 hours.  HbA1C: Hgb A1c MFr Bld  Date/Time Value Ref Range Status  01/29/2016 02:43 PM 8.0 (H) 4.8 - 5.6 % Final    Comment:    (NOTE)         Pre-diabetes: 5.7 - 6.4         Diabetes: >6.4         Glycemic control for adults with diabetes: <7.0     CBG: Recent Labs  Lab 11/25/23 1546 11/25/23 1909 11/25/23 2312 11/26/23 0314 11/26/23 0805  GLUCAP 162* 144* 176* 183* 241*    Review of Systems:   Unable to obtain: AMS  Past Medical History:  He,  has a past medical history of Chronic respiratory failure with hypoxia (HCC), Heart failure (HCC), Pneumonia (1956), and Snake bite (1980).   Surgical History:  History reviewed. No pertinent surgical history.   Social History:   reports that he quit smoking about 42 years ago. His smoking use included cigarettes. He started smoking about 62 years ago. He has a 20 pack-year smoking history. He has never used smokeless tobacco. He reports that he does not drink alcohol  and does not use drugs.   Family History:  His family history includes Asthma in his sister; Heart attack in his father.   Allergies No Known Allergies   Home Medications  Prior to Admission medications   Medication Sig Start Date End Date Taking? Authorizing Provider  aspirin  EC 81 MG tablet Take 81 mg by mouth 4 (four) times a week.    Yes [provider]  Coenzyme Q10 (COQ-10 PO) Take 1 Capful by mouth daily.   Yes [provider]  Multiple Vitamin (MULTIVITAMIN WITH MINERALS) TABS tablet Take 1 tablet by mouth daily.   Yes [provider]     Critical care time: 50 minutes    Christian Daniqua Campoy AGACNP-BC   [facility] 11/26/2023,  8:53 AM  Please see [email or web address] for pager details.  From 7A-7P if no response, please call [phone]. After hours, please call ELink [phone].

## 2023-11-26 NOTE — Progress Notes (Signed)
    Providing Compassionate, Quality Care - Together   NEUROSURGERY PROGRESS NOTE     S: Restraints applied overnight d/t patient grabbing at lines.    O: EXAM:  BP (!) 141/75   Pulse 75   Temp 99.7 F (37.6 C) (Axillary)   Resp 18   Ht 5' 6 (1.676 m)   Wt 81.8 kg   SpO2 96%   BMI 29.11 kg/m     Intubated, opens eyes to command FC RUE, BLE L hemiparetic PERRLA EVD draining at 10cm H20, blood tinged/CSF   ASSESSMENT:  73 y.o. with thalamic hemorrhage, intraventricular hemorrhage, hydrocephalus improved s/p placement of EVD 8/12    PLAN: -Continue EVD at 10cm H20 -Continue supportive care per primary team, appreciate mgmt.  -Call w/ questions/concerns.   Camie Pickle, [facility]

## 2023-11-26 NOTE — Progress Notes (Signed)
 STROKE TEAM PROGRESS NOTE   INTERIM HISTORY/SUBJECTIVE  Patient is little better today.  He is drowsy but arousable.  Still on ventilator support.  He is beginning to move all 4 extremities though right more than left.  Ventriculostomy is draining well.  Blood pressure adequately controlled.  Follow-up CT scan this morning shows slight improvement in hydrocephalus though it is persistent.  Hemorrhage is stable.  CBC    Component Value Date/Time   WBC 11.5 (H) 11/26/2023 0525   RBC 6.16 (H) 11/26/2023 0525   HGB 18.7 (H) 11/26/2023 1535   HCT 55.0 (H) 11/26/2023 1535   PLT 130 (L) 11/26/2023 0525   MCV 95.3 11/26/2023 0525   MCH 32.0 11/26/2023 0525   MCHC 33.6 11/26/2023 0525   RDW 13.9 11/26/2023 0525   LYMPHSABS 0.5 (L) 01/22/2016 0547   MONOABS 0.2 01/22/2016 0547   EOSABS 0.0 01/22/2016 0547   BASOSABS 0.0 01/22/2016 0547    BMET    Component Value Date/Time   NA 148 (H) 11/26/2023 1535   K 4.4 11/26/2023 1535   CL 110 11/26/2023 0525   CO2 26 11/26/2023 0525   GLUCOSE 199 (H) 11/26/2023 0525   BUN 34 (H) 11/26/2023 0525   CREATININE 1.04 11/26/2023 0525   CALCIUM 9.0 11/26/2023 0525   GFRNONAA >60 11/26/2023 0525    IMAGING past 24 hours CT HEAD WO CONTRAST ( ) Result Date: 11/26/2023 CLINICAL DATA:  Follow-up examination for hemorrhagic stroke. EXAM: CT HEAD WITHOUT CONTRAST TECHNIQUE: Contiguous axial images were obtained from the base of the skull through the vertex without intravenous contrast. RADIATION DOSE REDUCTION: This exam was performed according to the departmental dose-optimization program which includes automated exposure control, adjustment of the mA and/or kV according to patient size and/or use of iterative reconstruction technique. COMPARISON:  Prior CT from 11/24/2023 FINDINGS: Brain: Previously identified intraparenchymal hemorrhage centered at the superior right thalamus is relatively stable in size and morphology. Surrounding vasogenic edema with  trace localized right-to-left shift, similar. Intraventricular extension with blood in the lateral and third ventricles. There has been interval placement of a left frontal approach EVD with tip in the left lateral ventricle. Persistent hydrocephalus, slightly improved with biventricular volume measuring 4.8 cm, previously 5.0 cm. Scattered subarachnoid hemorrhage involving both cerebral hemispheres, overall slightly decreased. No new intracranial hemorrhage or large vessel territory infarct. No mass lesion. No extra-axial fluid collection. Vascular: No asymmetric hyperdense vessel. Calcified atherosclerosis present at skull base. Skull: Left frontal approach EVD with associated scalp soft tissue swelling. Sinuses/Orbits: Globes and orbital soft tissues within normal limits. Paranasal sinuses mastoid air cells remain largely clear. Other: None. IMPRESSION: 1. Interval placement of a left frontal approach EVD with tip in the left lateral ventricle. Persistent hydrocephalus, slightly improved. 2. Relatively stable size and morphology of intraparenchymal hemorrhage centered at the superior right thalamus. Surrounding vasogenic edema with trace localized right-to-left shift, stable. 3. Scattered subarachnoid hemorrhage involving both cerebral hemispheres, overall slightly decreased. 4. No other new acute intracranial abnormality. Electronically Signed   By: Morene Hoard M.D.   On: 11/26/2023 02:29     Vitals:   11/26/23 1430 11/26/23 1500 11/26/23 1530 11/26/23 1538  BP: 120/75 114/67 133/73   Pulse: 95 90 86   Resp: (!) 27 (!) 25 (!) 27   Temp:      TempSrc:      SpO2: 96% 97% 94% 96%  Weight:      Height:         PHYSICAL EXAM General:  Critically ill intubated patient CV: Regular rate and rhythm on monitor Respiratory: Mechanically ventilated  NEURO:  Mental Status: Intubated, no sedation.  Somnolent, but opens eyes to pain. Does  follow commands few simple midline commands  intermittently..   Cranial Nerves:  II: Pupils sluggish bilaterally. III, IV, VI: Does not track examiner. V: Sensation is intact to light touch and symmetrical to face.  VII: UTA due to ETT IX, X: Weak cough and gag present.  Motor:/Sensory: Purposeful RUE, BLE Minor withdraw LUE    ASSESSMENT/PLAN  [name] is a 73 y.o. male with history of COPD, heart failure, presents with left-sided weakness that started abruptly   NIH on Admission 5  Intracerebral Hemorrhage and IVH  right thalamus, multifocal subarachnoid hemorrhage  Etiology: Likely hypertensive CT head Stable 3 cm hemorrhage in the right thalamus and intraventricular hemorrhage, right greater than left. 2. New multifocal subarachnoid hemorrhage bilaterally, most prominent over the left parietal lobe, right frontal and perculum, and in the occipital lobes bilaterally. CTA head & neck no LVO Overnight CTH 8/12 (due to mental status decline) 3.8 x 2.3 x 3.9 cm intraparenchymal hemorrhage centered at the superior right thalamus, relatively stable from prior MRI. Surrounding vasogenic edema, mildly progressed. Trace right-to-left shift at the septum pellucidum. Intraventricular extension with blood in both lateral ventricles as well as the third ventricle. Increased lateral and third ventriculomegaly since previous, compatible with progressive hydrocephalus. Scattered subarachnoid hemorrhage involving both cerebral hemispheres, relatively similar to prior 8/13 CT Head: Ordered MRI 8/10  4.0 x 2.4 x 3.8 cm intraparenchymal hemorrhage centered at the superior right thalamus, slightly increased in size from previous (estimated volume 18 mL, previously 12.5 mL). Intraventricular extension with blood in the lateral and third ventricles, similar. Stable ventricular size and morphology without progressive hydrocephalus. 2D Echo ejection fraction 60 to 65%.  Left atrial size normal. LDL 47 HgbA1c pending VTE prophylaxis  -SCDs aspirin  81 mg daily prior to admission, continue No antithrombotic due to acute hemorrhage Therapy recommendations:  Pending Disposition: Pending  Obstructive Hydrocephalus EVD in place Draining approx 90ml/hr, serosang Managed by neurosurgery  Acute on chronic respiratory failure with hypoxia Intubated overnight due to decreased mental status and impaired airway protection. CCM management, appreciate assistance. CT Chest 8/11: Pulmonary parenchyma pattern of interstitial lung disease, questionably due to fibrotic hypersensitive pneumonitis or fibrotic nonspecific interstitial pneumonitis CXR 8/12: Stable multifocal pulmonary infiltrates  Hypertension HFp EF Home meds: None Started on Norvasc  5 mg, losartan  50 mg Beta-blocker as needed to maintain BP goal Blood Pressure Goal: SBP between 130-150 for 24 hours and then less than 160   Hyperlipidemia Home meds: None LDL 47 goal < 70 High intensity statin not indicated due to LDL within goal  DM Home meds: Metformin  A1c: PENDING CBG, SSI  Dysphagia Patient has post-stroke dysphagia, SLP consulted    Diet   Diet NPO time specified   Start tube feeds, nutrition consult  Other Stroke Risk Factors Congestive heart failure  Other Active Problems AKI, Cr 0.91--1.36 Continue strict I/O, daily labs Leukocytosis-WBC 12.5, afebrile Polycythemia-Hgb 19.4, HCT 56  Patient neurological exam shows slight improvement.  Continue ventilatory support for respiratory failure and strict control of blood pressure with systolic goal below 160.  Continue ventriculostomy drainage as per neurosurgery.  Hopefully wean off ventilator support over the next few days.  Discussed with patient's family at bedside and answered questions.  Discussed with critical care team. This patient is critically ill and at significant risk of neurological  worsening, death and care requires constant monitoring of vital signs, hemodynamics,respiratory and  cardiac monitoring, extensive review of multiple databases, frequent neurological assessment, discussion with family, other specialists and medical decision making of high complexity.I have made any additions or clarifications directly to the above note.This critical care time does not reflect procedure time, or teaching time or supervisory time of PA/NP/Med Resident etc but could involve care discussion time.  I spent 30 minutes of neurocritical care time  in the care of  this patient.      Eather Popp, MD Medical Director [facility] Pager: [phone] 11/26/2023 3:41 PM

## 2023-11-26 NOTE — Progress Notes (Signed)
 eLink Physician-Brief Progress Note Patient Name: Zachary Brown DOB: May 17, 1950 MRN: [id number]   Date of Service  11/26/2023  HPI/Events of Note  Request for restrains as patient reaching for tubes  eICU Interventions  24 hr soft b/L wrist restraints ordered seen on camera     Intervention Category Major Interventions: Respiratory failure - evaluation and management  [name] 11/26/2023, 5:56 AM

## 2023-11-27 ENCOUNTER — Inpatient Hospital Stay (HOSPITAL_COMMUNITY)

## 2023-11-27 DIAGNOSIS — N179 Acute kidney failure, unspecified: Secondary | ICD-10-CM | POA: Diagnosis not present

## 2023-11-27 DIAGNOSIS — I618 Other nontraumatic intracerebral hemorrhage: Secondary | ICD-10-CM | POA: Diagnosis not present

## 2023-11-27 DIAGNOSIS — S06311A Contusion and laceration of right cerebrum with loss of consciousness of 30 minutes or less, initial encounter: Secondary | ICD-10-CM | POA: Diagnosis not present

## 2023-11-27 DIAGNOSIS — I110 Hypertensive heart disease with heart failure: Secondary | ICD-10-CM | POA: Diagnosis not present

## 2023-11-27 DIAGNOSIS — G936 Cerebral edema: Secondary | ICD-10-CM

## 2023-11-27 DIAGNOSIS — I609 Nontraumatic subarachnoid hemorrhage, unspecified: Secondary | ICD-10-CM | POA: Diagnosis not present

## 2023-11-27 DIAGNOSIS — I5030 Unspecified diastolic (congestive) heart failure: Secondary | ICD-10-CM | POA: Diagnosis not present

## 2023-11-27 DIAGNOSIS — D72829 Elevated white blood cell count, unspecified: Secondary | ICD-10-CM

## 2023-11-27 DIAGNOSIS — J9621 Acute and chronic respiratory failure with hypoxia: Secondary | ICD-10-CM | POA: Diagnosis not present

## 2023-11-27 LAB — RESPIRATORY PANEL BY PCR

## 2023-11-27 LAB — CBC WITH DIFFERENTIAL/PLATELET
Abs Immature Granulocytes: 0.05 K/uL (ref 0.00–0.07)
Basophils Absolute: 0 K/uL (ref 0.0–0.1)
Basophils Relative: 0 %
Eosinophils Absolute: 0 K/uL (ref 0.0–0.5)
Eosinophils Relative: 0 %
HCT: 58.1 % — ABNORMAL HIGH (ref 39.0–52.0)
Hemoglobin: 19.5 g/dL — ABNORMAL HIGH (ref 13.0–17.0)
Immature Granulocytes: 0 %
Lymphocytes Relative: 13 %
Lymphs Abs: 1.7 K/uL (ref 0.7–4.0)
MCH: 32 pg (ref 26.0–34.0)
MCHC: 33.6 g/dL (ref 30.0–36.0)
MCV: 95.4 fL (ref 80.0–100.0)
Monocytes Absolute: 0.9 K/uL (ref 0.1–1.0)
Monocytes Relative: 7 %
Neutro Abs: 10.4 K/uL — ABNORMAL HIGH (ref 1.7–7.7)
Neutrophils Relative %: 80 %
Platelets: 109 K/uL — ABNORMAL LOW (ref 150–400)
RBC: 6.09 MIL/uL — ABNORMAL HIGH (ref 4.22–5.81)
RDW: 13.7 % (ref 11.5–15.5)
WBC: 13.1 K/uL — ABNORMAL HIGH (ref 4.0–10.5)
nRBC: 0 % (ref 0.0–0.2)

## 2023-11-27 LAB — RESP PANEL BY RT-PCR (RSV, FLU A&B, COVID)  RVPGX2
Influenza A by PCR: NEGATIVE
Influenza B by PCR: NEGATIVE
Resp Syncytial Virus by PCR: NEGATIVE
SARS Coronavirus 2 by RT PCR: NEGATIVE

## 2023-11-27 LAB — BASIC METABOLIC PANEL WITH GFR
Anion gap: 6 (ref 5–15)
BUN: 31 mg/dL — ABNORMAL HIGH (ref 8–23)
CO2: 28 mmol/L (ref 22–32)
Calcium: 8.9 mg/dL (ref 8.9–10.3)
Chloride: 112 mmol/L — ABNORMAL HIGH (ref 98–111)
Creatinine, Ser: 1.08 mg/dL (ref 0.61–1.24)
GFR, Estimated: >60 mL/min (ref >60)
Glucose, Bld: 212 mg/dL — ABNORMAL HIGH (ref 70–99)
Potassium: 3.6 mmol/L (ref 3.5–5.1)
Sodium: 146 mmol/L — ABNORMAL HIGH (ref 135–145)

## 2023-11-27 LAB — GLUCOSE, CAPILLARY
Glucose-Capillary: 261 mg/dL — ABNORMAL HIGH (ref 70–99)
Glucose-Capillary: 249 mg/dL — ABNORMAL HIGH (ref 70–99)
Glucose-Capillary: 308 mg/dL — ABNORMAL HIGH (ref 70–99)
Glucose-Capillary: 181 mg/dL — ABNORMAL HIGH (ref 70–99)
Glucose-Capillary: 138 mg/dL — ABNORMAL HIGH (ref 70–99)
Glucose-Capillary: 181 mg/dL — ABNORMAL HIGH (ref 70–99)

## 2023-11-27 LAB — HEMOGLOBIN A1C
Hgb A1c MFr Bld: 6.3 % — ABNORMAL HIGH (ref 4.8–5.6)
Mean Plasma Glucose: 134.11 mg/dL

## 2023-11-27 LAB — PHOSPHORUS: Phosphorus: 2.2 mg/dL — ABNORMAL LOW (ref 2.5–4.6)

## 2023-11-27 LAB — HEPATITIS B SURFACE ANTIBODY, QUANTITATIVE: Hep B S AB Quant (Post): <3.5 m[IU]/mL — ABNORMAL LOW

## 2023-11-27 MED ORDER — INSULIN GLARGINE-YFGN 100 UNIT/ML ~~LOC~~ SOLN
10.0000 [IU] | Freq: Every day | SUBCUTANEOUS | Status: DC
  Administered 2023-11-27: 10 [IU] via SUBCUTANEOUS
  Filled 2023-11-27: qty 0.1

## 2023-11-27 MED ORDER — FUROSEMIDE 10 MG/ML IJ SOLN
40.0000 mg | Freq: Once | INTRAMUSCULAR | Status: AC
  Administered 2023-11-27: 40 mg via INTRAVENOUS
  Filled 2023-11-27: qty 4

## 2023-11-27 MED ORDER — BISACODYL 10 MG RE SUPP
10.0000 mg | Freq: Every day | RECTAL | Status: DC | PRN
  Administered 2023-11-27: 10 mg via RECTAL
  Filled 2023-11-27: qty 1

## 2023-11-27 MED ORDER — POTASSIUM PHOSPHATES 15 MMOLE/5ML IV SOLN
30.0000 mmol | Freq: Once | INTRAVENOUS | Status: AC
  Administered 2023-11-27: 30 mmol via INTRAVENOUS
  Filled 2023-11-27: qty 10

## 2023-11-27 MED ORDER — INSULIN GLARGINE-YFGN 100 UNIT/ML ~~LOC~~ SOLN
10.0000 [IU] | Freq: Two times a day (BID) | SUBCUTANEOUS | Status: DC
  Administered 2023-11-27 – 2023-11-28 (×3): 10 [IU] via SUBCUTANEOUS
  Filled 2023-11-27 (×5): qty 0.1

## 2023-11-27 MED ORDER — SODIUM CHLORIDE 3 % IN NEBU
4.0000 mL | INHALATION_SOLUTION | Freq: Four times a day (QID) | RESPIRATORY_TRACT | Status: DC
  Administered 2023-11-27 – 2023-11-29 (×7): 4 mL via RESPIRATORY_TRACT
  Filled 2023-11-27 (×7): qty 4

## 2023-11-27 MED ORDER — INSULIN ASPART 100 UNIT/ML IJ SOLN
0.0000 [IU] | INTRAMUSCULAR | Status: DC
  Administered 2023-11-27: 15 [IU] via SUBCUTANEOUS
  Administered 2023-11-27 (×2): 4 [IU] via SUBCUTANEOUS
  Administered 2023-11-27: 3 [IU] via SUBCUTANEOUS
  Administered 2023-11-28 (×2): 4 [IU] via SUBCUTANEOUS
  Administered 2023-11-28: 7 [IU] via SUBCUTANEOUS
  Administered 2023-11-28: 4 [IU] via SUBCUTANEOUS
  Administered 2023-11-28: 7 [IU] via SUBCUTANEOUS
  Administered 2023-11-28: 4 [IU] via SUBCUTANEOUS
  Administered 2023-11-29: 7 [IU] via SUBCUTANEOUS
  Administered 2023-11-29: 4 [IU] via SUBCUTANEOUS

## 2023-11-27 MED ORDER — DOCUSATE SODIUM 50 MG/5ML PO LIQD
50.0000 mg | Freq: Two times a day (BID) | ORAL | Status: DC | PRN
  Administered 2023-11-27: 50 mg
  Filled 2023-11-27: qty 10

## 2023-11-27 MED ORDER — POLYETHYLENE GLYCOL 3350 17 G PO PACK
17.0000 g | PACK | Freq: Two times a day (BID) | ORAL | Status: DC
  Administered 2023-11-27 – 2023-11-28 (×3): 17 g via ORAL
  Filled 2023-11-27 (×4): qty 1

## 2023-11-27 MED ORDER — INSULIN ASPART 100 UNIT/ML IJ SOLN
6.0000 [IU] | INTRAMUSCULAR | Status: DC
  Administered 2023-11-27 – 2023-11-29 (×12): 6 [IU] via SUBCUTANEOUS

## 2023-11-27 NOTE — Progress Notes (Signed)
 STROKE TEAM PROGRESS NOTE   INTERIM HISTORY/SUBJECTIVE  Patient still intubated for respiratory failure.[name]  He is drowsy but arousable.  Still on ventilator support.  He is beginning to move all 4 extremities though right more than left.  Ventriculostomy is draining well.  Blood pressure adequately controlled.  Neurosurgery planning to continue ventriculostomy drainage through the weekend and try challenging him next week. CBC    Component Value Date/Time   WBC 13.1 (H) 11/27/2023 0942   RBC 6.09 (H) 11/27/2023 0942   HGB 19.5 (H) 11/27/2023 0942   HCT 58.1 (H) 11/27/2023 0942   PLT 109 (L) 11/27/2023 0942   MCV 95.4 11/27/2023 0942   MCH 32.0 11/27/2023 0942   MCHC 33.6 11/27/2023 0942   RDW 13.7 11/27/2023 0942   LYMPHSABS 1.7 11/27/2023 0942   MONOABS 0.9 11/27/2023 0942   EOSABS 0.0 11/27/2023 0942   BASOSABS 0.0 11/27/2023 0942    BMET    Component Value Date/Time   NA 146 (H) 11/27/2023 0542   K 3.6 11/27/2023 0542   CL 112 (H) 11/27/2023 0542   CO2 28 11/27/2023 0542   GLUCOSE 212 (H) 11/27/2023 0542   BUN 31 (H) 11/27/2023 0542   CREATININE 1.08 11/27/2023 0542   CALCIUM 8.9 11/27/2023 0542   GFRNONAA >60 11/27/2023 0542    IMAGING past 24 hours DG Chest Port 1 View Result Date: 11/27/2023 CLINICAL DATA:  Acute respiratory failure.  Cerebrovascular accident EXAM: PORTABLE CHEST 1 VIEW COMPARISON:  11/26/2023 FINDINGS: Endotracheal tube and feeding tube unchanged. Stable cardiac silhouette. Increased RIGHT infrahilar airspace disease. Mild edema pattern. No pneumothorax. IMPRESSION: 1. Increased RIGHT infrahilar airspace disease suggesting atelectasis, pneumonia or aspiration pneumonitis. 2. Mild venous congestion pattern Electronically Signed   By: Jackquline Boxer M.D.   On: 11/27/2023 09:33   DG Chest Port 1 View Result Date: 11/26/2023 CLINICAL DATA:  Acute respiratory failure EXAM: PORTABLE CHEST 1 VIEW COMPARISON:  Chest radiograph November 25, 2023 FINDINGS:  Endotracheal tube terminates 2.1 cm proximal to carina. Enteric tube in place tip below diaphragm extending outside the field of view. Borderline heart size, slightly enlarged to prior. Diffuse interstitial markings throughout both lungs right greater than left, increased to prior. Enlarged right hilum with interval development of right perihilar consolidation/airspace opacity. No significant pleural effusion.  No pneumothorax. No acute osseous abnormality. IMPRESSION: Interval increase in interstitial markings predominantly in right lung, enlarged right hilum and new airspace opacity/consolidation in right lower hemithorax. No significant pleural effusion. Electronically Signed   By: Megan  Zare M.D.   On: 11/26/2023 16:04     Vitals:   11/27/23 1159 11/27/23 1200 11/27/23 1300 11/27/23 1400  BP:  119/68 117/67 129/73  Pulse: 85 85 90 87  Resp: (!) 22 (!) 22 (!) 24 (!) 22  Temp:  98.8 F (37.1 C)    TempSrc:  Axillary    SpO2: 96% 96% 97% 97%  Weight:      Height:         PHYSICAL EXAM General:  Critically ill intubated patient CV: Regular rate and rhythm on monitor Respiratory: Mechanically ventilated  NEURO:  Mental Status: Intubated, no sedation.  Somnolent, but opens eyes to pain. Does  follow commands few simple midline commands intermittently..   Cranial Nerves:  II: Pupils sluggish bilaterally. III, IV, VI: Does not track examiner. V: Sensation is intact to light touch and symmetrical to face.  VII: UTA due to ETT IX, X: Weak cough and gag present.  Motor:/Sensory: Purposeful RUE,  BLE Minor withdraw LUE    ASSESSMENT/PLAN  [name] is a 73 y.o. male with history of COPD, heart failure, presents with left-sided weakness that started abruptly   NIH on Admission 5  Intracerebral Hemorrhage and IVH  right thalamus, multifocal subarachnoid hemorrhage  Etiology: Likely hypertensive CT head Stable 3 cm hemorrhage in the right thalamus and  intraventricular hemorrhage, right greater than left. 2. New multifocal subarachnoid hemorrhage bilaterally, most prominent over the left parietal lobe, right frontal and perculum, and in the occipital lobes bilaterally. CTA head & neck no LVO Overnight CTH 8/12 (due to mental status decline) 3.8 x 2.3 x 3.9 cm intraparenchymal hemorrhage centered at the superior right thalamus, relatively stable from prior MRI. Surrounding vasogenic edema, mildly progressed. Trace right-to-left shift at the septum pellucidum. Intraventricular extension with blood in both lateral ventricles as well as the third ventricle. Increased lateral and third ventriculomegaly since previous, compatible with progressive hydrocephalus. Scattered subarachnoid hemorrhage involving both cerebral hemispheres, relatively similar to prior 8/13 CT Head: Ordered MRI 8/10  4.0 x 2.4 x 3.8 cm intraparenchymal hemorrhage centered at the superior right thalamus, slightly increased in size from previous (estimated volume 18 mL, previously 12.5 mL). Intraventricular extension with blood in the lateral and third ventricles, similar. Stable ventricular size and morphology without progressive hydrocephalus. 2D Echo ejection fraction 60 to 65%.  Left atrial size normal. LDL 47 HgbA1c pending VTE prophylaxis -SCDs aspirin  81 mg daily prior to admission, continue No antithrombotic due to acute hemorrhage Therapy recommendations:  Pending Disposition: Pending  Obstructive Hydrocephalus EVD in place Draining approx 84ml/hr, serosang Managed by neurosurgery  Acute on chronic respiratory failure with hypoxia Intubated overnight due to decreased mental status and impaired airway protection. CCM management, appreciate assistance. CT Chest 8/11: Pulmonary parenchyma pattern of interstitial lung disease, questionably due to fibrotic hypersensitive pneumonitis or fibrotic nonspecific interstitial pneumonitis CXR 8/12: Stable multifocal pulmonary  infiltrates  Hypertension HFp EF Home meds: None Started on Norvasc  5 mg, losartan  50 mg Beta-blocker as needed to maintain BP goal Blood Pressure Goal: SBP between 130-150 for 24 hours and then less than 160   Hyperlipidemia Home meds: None LDL 47 goal < 70 High intensity statin not indicated due to LDL within goal  DM Home meds: Metformin  A1c: PENDING CBG, SSI  Dysphagia Patient has post-stroke dysphagia, SLP consulted    Diet   Diet NPO time specified   Start tube feeds, nutrition consult  Other Stroke Risk Factors Congestive heart failure  Other Active Problems AKI, Cr 0.91--1.36 Continue strict I/O, daily labs Leukocytosis-WBC 12.5, afebrile Polycythemia-Hgb 19.4, HCT 56  Patient neurological exam shows slight improvement.  Continue ventilatory support for respiratory failure and strict control of blood pressure with systolic goal below 160.  Continue ventriculostomy drainage as per neurosurgery.  Hopefully wean off ventilator support over the next few days.  Discussed with patient's family at bedside and answered questions.  Discussed with Dr. Harold critical care team.  This patient is critically ill and at significant risk of neurological worsening, death and care requires constant monitoring of vital signs, hemodynamics,respiratory and cardiac monitoring, extensive review of multiple databases, frequent neurological assessment, discussion with family, other specialists and medical decision making of high complexity.I have made any additions or clarifications directly to the above note.This critical care time does not reflect procedure time, or teaching time or supervisory time of PA/NP/Med Resident etc but could involve care discussion time.  I spent 30 minutes of neurocritical care time  in the care of  this patient.      Eather Popp, MD Medical Director [facility] Pager: [phone] 11/27/2023 2:11 PM

## 2023-11-27 NOTE — Progress Notes (Signed)
  Interdisciplinary Goals of Care Family Meeting   Date carried out:: 11/27/2023  Location of the meeting: Phone conference  Member's involved: Nurse Practitioner and Family Member or next of kin  Durable Power of Attorney or acting medical decision maker: Daughter Jesusa)   Discussion: We discussed goals of care for Lissandro Dilorenzo via phone. Unfortunately, daughter cannot visit because of battling Covid-19 infection. However, did discuss goals of care via phone. Per daughter, patient would not want long-term life support or long-term rehab facility. Patient has been weaning on ventilator since approximately 0745. Patient not showing any signs of distress, mentation waxing/waning. Did notify daughter that patient was at the point of extubation. Also, discussed and recommended not to reintubate in setting of respiratory distress/insufficiency due to patient not wanting long-term life support (tracheostomy) or rehabilitation. Daughter in agreement in regards to this. Went over DNR/DNI order and daughter did verbalize that she did agree with not resuscitating patient in setting of respiratory/cardiac arrest- no CPR/Compressions/defibrillation/ACLS medications/Mechanical Ventilator. If patient decompensates, will need to transition to comfort measures. Also, discussed with son in law at beside.   Code status: Full DNR  Disposition: Continue current acute care If patient decompensates- transition to comfort care   Continue Precedex  gtt   Time spent for the meeting: 45 mins   Christian Daouda Lonzo AGACNP-BC   [facility] 11/27/2023, 3:06 PM  Please see [email or web address] for pager details.  From 7A-7P if no response, please call [phone]. After hours, please call ELink [phone].

## 2023-11-27 NOTE — Progress Notes (Signed)
    Providing Compassionate, Quality Care - Together   NEUROSURGERY PROGRESS NOTE     S: neurologically stable   O: EXAM:  BP (!) 91/51   Pulse 76   Temp 100 F (37.8 C) (Axillary)   Resp (!) 21   Ht 5' 6 (1.676 m)   Wt 81.4 kg   SpO2 95%   BMI 28.96 kg/m     Intubated, opens eyes to command FC RUE, BLE L hemiparetic PERRLA EVD draining at 10cm H20, blood tinged/CSF   ASSESSMENT:  73 y.o. with thalamic hemorrhage, intraventricular hemorrhage, hydrocephalus improved s/p placement of EVD 8/12    PLAN: -Continue EVD at 10cm H20. Plan to drain through weekend and wean EVD next week -Continue supportive care per primary team, appreciate mgmt.  -Call w/ questions/concerns.

## 2023-11-27 NOTE — Procedures (Signed)
 Extubation Procedure Note  Patient Details:   Name: Zachary Brown DOB: 1950-09-24 MRN: [id number]   Airway Documentation:    Vent end date: 11/27/23 Vent end time: 1432   Evaluation  O2 sats: stable throughout Complications: No apparent complications Patient did tolerate procedure well. Bilateral Breath Sounds: Clear   No  Positive cuff leak prior to extubation. Placed on 4L [place]  Dena VEAR Samuel 11/27/2023, 2:32 PM

## 2023-11-27 NOTE — Inpatient Diabetes Management (Signed)
 Inpatient Diabetes Program Recommendations  AACE/ADA: New Consensus Statement on Inpatient Glycemic Control   Target Ranges:  Prepandial:   less than 140 mg/dL      Peak postprandial:   less than 180 mg/dL (1-2 hours)      Critically ill patients:  140 - 180 mg/dL    Latest Reference Range & Units 11/26/23 08:05 11/26/23 11:22 11/26/23 16:04 11/26/23 19:13 11/26/23 23:10 11/27/23 03:16 11/27/23 07:47  Glucose-Capillary 70 - 99 mg/dL 758 (H) 838 (H) 751 (H) 234 (H) 257 (H) 261 (H) 249 (H)   Review of Glycemic Control  Diabetes history: DM2 Outpatient Diabetes medications: None listed on home medication list Current orders for Inpatient glycemic control: Semglee  10 units daily, Novolog  0-20 units Q4H, Novolog  4 units Q4H; Osmolite @ 55 ml/hr  Inpatient Diabetes Program Recommendations:    Insulin : Noted Semglee  increased from 6 to 10 units daily today.  Please consider adding Semglee  5 units at bedtime and increasing tube feeding coverage to Novolog  7 units Q4H.  HbgA1C: May want to consider reordering A1C given A1C was ordered on 11/21/23 an still showing in process.  Thanks, Earnie Gainer, RN, MSN, CDCES Diabetes Coordinator Inpatient Diabetes Program [phone] (Team Pager from 8am to 5pm)

## 2023-11-27 NOTE — Progress Notes (Signed)
 NAME:  Zachary Brown, MRN:  [id number], DOB:  07/19/1950, LOS: 6 ADMISSION DATE:  11/21/2023, CONSULTATION DATE:  11/21/23  REFERRING MD:  Dr. Raford ER, CHIEF COMPLAINT:  ICH   History of Present Illness:  73 year old male with PMH as below who presented to [facility] ED 8/8 after being found on the floor by his daughter. He reports having possibly falling although he does not remember the events surrounding the incident, but feels as though he did not hit his head. He estimates being on the floor for approximately three hours prior to being found by his daughter. ED documentation reports the patient walking and left leg growing weak as well as Left arm numbness prior to fall. . Other areas on injury include the left forearm. Upon arrival to the ED he was noted to have left sided weakness and was immediatly taken for CT of the head. Found to have right sided basal ganglia IPH with intraventricular extension. He was started on nicardipine infusion for blood pressure control . Neurosurgery at [facility] was consulted and recommended transfer for neuro ICU admission. PCCM accepted the patient for transfer.   11/25/23 - patient transferred to the ICU post head CT earlier after acute mental status changes, emergently intubated. Discussed with Neurologist bedside, planning for EVD placement per Neurosurgeon.  Pertinent  Medical History   Past Medical History:  Diagnosis Date   Chronic respiratory failure with hypoxia (HCC)    Heart failure (HCC)    Pneumonia 1956   severe   Snake bite 1980   copper heaed     Significant Hospital Events: Including procedures, antibiotic start and stop dates in addition to other pertinent events   8/12- transfer to ICU for airway support and EVD placement 8/13- Intermittent command following, weaning on vent, will plan to extubate after cortrak placment  8/14 patient failing weaning trial-having increased work of breathing, desaturations, thick tracheal inline  secretions-concern for possible aspiration pneumonia-started on Zosyn , given Lasix   Interim History / Subjective:  Patient following commands, on minimal sedation Currently weaning at this time EVD still at 10 mL/h-minimal serosanguineous output   Objective    Blood pressure (!) 101/57, pulse 71, temperature 98.9 F (37.2 C), temperature source Axillary, resp. rate 20, height 5' 6 (1.676 m), weight 81.4 kg, SpO2 97%.    Vent Mode: PRVC FiO2 (%):  [40 %] 40 % Set Rate:  [18 bmp] 18 bmp Vt Set:  [510 mL] 510 mL PEEP:  [5 cmH20] 5 cmH20 Plateau Pressure:  [15 cmH20-16 cmH20] 15 cmH20   Intake/Output Summary (Last 24 hours) at 11/27/2023 9188 Last data filed at 11/27/2023 0800 Gross per 24 hour  Intake 2517.59 ml  Output 1903 ml  Net 614.59 ml   Filed Weights   11/25/23 0702 11/26/23 0500 11/27/23 0500  Weight: 83.7 kg 81.8 kg 81.4 kg    Examination: General: acute on chronic older adult male, lying in icu bed on vent NAD HEENT: Normocephalic, EVD at 10 mL/h PERRLA intact, ETT, core track, Pink MM CV: s1,s2, RRR, no MRG, No JVD  pulm: clear, diminished, no distress on vent, thick Scobey secretions from tracheal Abs: bs active, soft  Extremities: no edema, no deformity, moves all extremities on command  Skin: no rash  Neuro: Rass -1-1, responds to painful stimuli, cough gag reflex present  GU: Male PureWick  Resolved problem list   Assessment and Plan  Intraparenchymal hemorrhage: right basal ganglia with extension into the right lateral and third  ventricles. Concern for hypertensive bleed based on location, but did fall and he has scalp soft tissue injury.  Intraventricular hemorrhage Subarachnoid hemorrhage 11/24/23 - repeat head CT reviewed, showing slight worsening of ICH with ventricular extension - Neurologist bedside discussed with Neurosurgeon - planning for EVD placement. EVD placement at 73ml/hr- minimal serosanguineous output  8/12 repeat scan nterval placement  of a left frontal approach EVD with tip in the left lateral ventricle. Persistent hydrocephalus, slightly improved. Relatively stable size intraparenchymal hemorrhage centered at the superior right thalamus. Surrounding vasogenic edema with trace localized right-to-left shift, stable.Scattered subarachnoid hemorrhage involving both cerebralhemispheres, overall slightly decreased. No other new acute intracranial abnormality. P: SBP goal less than 160 Not requiring any pressors at this time Had to be restarted on low-dose fentanyl  and Precedex  drip yesterday afternoon due to increased work of breathing on ventilator/agitation-suspect this was due to possible aspiration pneumonia developing-see chest x-ray Continue Norvasc , labetalol /hydralazine  as needed to meet SBP goal Continue neuroprotective measures: Euvolemia, euglycemia, normoxia, normocapnia, and maintain electrolytes within normal limits-daily BMP checks Continue postop neurosurg management-continue to evaluate EVD drainage/site Frequent neurochecks   Acute on chronic hypoxic respiratory failure requiring vent for airway protection secondary to above  - intubated on 11/25/23 for mental status changes - resume vent bundle/protocol. Possible ILD: started workup in pulm office 2017 but lost to follow up.  Has not had access to his home oxygen, concentrator has been broken.  - - former baseline 2 L/min although he has not used the oxygen for an extended time as it has not been available. Ct Chest high resolution on 8/11- Pulmonary parenchymal pattern of interstitial lung disease, as detailed above, may be due to fibrotic hypersensitivity pneumonitis or fibrotic nonspecific interstitial pneumonitis.  8/13-patient failed weaning trial and developed distress on ventilator/hypoxia.  Having thick tan inline tracheal secretions-tracheal aspirate sent and chest x-ray obtained showing-right consolidation/opacity in the hemithorax MRSA negative, tracheal  aspirate culture pending Procal <0.10 P:  Continue ventilator support and lung protective strategies Continue LTVV Continue to wean PEEP and FiO2 requirements to a sat goal of greater than 90%-currently patient is on minimal ventilator settings and currently weaning at this time Head bed greater than 30 degrees Plat less than 30 Aim for driving pressures less than 15 VAP and PAD protocols in place Started on low-dose fentanyl /Precedex  yesterday on 8/13-wean off fentanyl  completely, decrease Precedex  per titration orders-plan will be to extubate if patient does not fail weaning trial Will give additional dose of Lasix  40 mg IV Continue Zosyn    AKI- improving  Hypophosphatemia- Phos 2.4> 2.2 0.91>1.36> 1.04> 1.08 Possibly in setting of hypotension overnight and from IPH as above P: Continue to trend renal function daily  Continue to monitor and optimize electrolytes daily Continue to monitor urine output Continue strict I/Os Continue Adequate renal perfusion  Avoid nephrotoxic agents  Replace phosphorus with K-Phos Given additional dose of Lasix  40 mg IV   Hx HFpEF 11/22/23- EF 60 to 65%  Regional wall abnormalities, grade 1 diastolic dysfunction, RV mildy dilated, RV mildly reduced  ? Syncope Secondary PAH based on echocardiogram Suspect that PAH is due to noncompliance with oxygen over the last several years.  Further workup as an outpatient appropriate including screening for VTE, OSA if clinical suspicion, etc. Still 858.6 mL positive since admission P: Continue cardiac telemetry Continue to evaluate fluid volume status-will give additional dose of Lasix  40 mg IV to assist with extubation later today Continues to remain off Levophed  gtt Give additional Lasix  40  mg IV  Elevated bilirubin Mild elevation in LFTS- AST 50, ALT 62  8/8 US  Limited RUQ- no gall stones or wall thickening, normal US   Total bili > 3.8 > 3.1  Hepatitis panel-overall negative, low hepatitis B surface  antibody-not consistent with immunity Hep C the RNA test still pending P: Check LFTs on 8/15 Continue to follow bili Continue to avoid hepatotoxic agents Ensure adequate perfusion  Erythrocytosis  Elevated hemoglobin  Leukocytosis  Per discussion with daughter, oxygen runs between mid 80s to low 90s, not sure how often this is measured, and when this has been evaluated during periods of exertion vs resting  Patient has not been wearing oxygen for sometime per report, suspect this is related to chronic hypoxemia but cannot fully rule out primary erythrocytosis  Hgb 21.3>> 20.2>>19.0>19.1 WBC trending down- 18.4>13.0> 11.5  P: CBC pending, follow up with results If hemoglobin continues to be elevated consider getting heme/unk consult once medically stable Continue to trend CBC daily Currently hemoglobin trending down   DM - Unclear whether he was taking metformin  as per his medication list. CBGs not at goal - last 2 checks 241, 183  CBG still not at goal, greater than 200 P: Continue to hold metformin  Continue CBG checks every 4, increase moderate scale to resistant Continue long-acting Semglee , increase to 10 units daily Continue tube feed coverage Continue tube feedings per core track   Best Practice (right click and Reselect all SmartList Selections daily)   Diet/type: NPO- place cortrak  DVT prophylaxis SCD Pressure ulcer(s): N/A GI prophylaxis: H2B Lines: N/A Foley:  Plan to insert Code Status:  full code Last date of multidisciplinary goals of care discussion [11/26/23]  Labs   CBC: Recent Labs  Lab 11/22/23 0609 11/24/23 0418 11/25/23 0136 11/25/23 0658 11/25/23 1128 11/25/23 1327 11/26/23 0525 11/26/23 1535  WBC 12.5* 13.1*  --  18.4* 13.0*  --  11.5*  --   HGB 19.4* 20.1*   < > 21.3* 20.2* 19.0* 19.7* 18.7*  HCT 56.6* 58.8*   < > 62.8* 59.0* 56.0* 58.7* 55.0*  MCV 92.2 92.5  --  93.7 93.4  --  95.3  --   PLT 141* 156  --  211 135*  --  130*  --    <  > = values in this interval not displayed.    Basic Metabolic Panel: Recent Labs  Lab 11/22/23 0609 11/24/23 0418 11/25/23 0045 11/25/23 0658 11/25/23 1128 11/25/23 1327 11/26/23 0525 11/26/23 1535 11/27/23 0542  NA 137 138   < > 141 142 144 145 148* 146*  K 4.2 4.1   < > 4.0  --  3.7 3.8 4.4 3.6  CL 103 100  --  107  --   --  110  --  112*  CO2 25 29  --  21*  --   --  26  --  28  GLUCOSE 167* 139*  --  176*  --   --  199*  --  212*  BUN 14 20  --  36*  --   --  34*  --  31*  CREATININE 1.02 0.91  --  1.36*  --   --  1.04  --  1.08  CALCIUM 8.8* 9.0  --  9.0  --   --  9.0  --  8.9  MG 1.8 2.0  --  2.1  --   --  2.1  --   --   PHOS 3.9 3.1  --  2.7  --   --  2.4*  --  2.2*   < > = values in this interval not displayed.   GFR: Estimated Creatinine Clearance: 62.8 mL/min (by C-G formula based on SCr of 1.08 mg/dL). Recent Labs  Lab 11/24/23 0418 11/25/23 0658 11/25/23 1128 11/26/23 0525 11/26/23 1547  PROCALCITON  --   --   --   --  <0.10  WBC 13.1* 18.4* 13.0* 11.5*  --     Liver Function Tests: Recent Labs  Lab 11/22/23 0609 11/25/23 0658 11/26/23 0525  AST 30 22 50*  ALT 24 23 62*  ALKPHOS 56 59 48  BILITOT 2.4* 3.8* 3.1*  PROT 6.5 6.7 5.8*  ALBUMIN 3.4* 3.6 2.9*   No results for input(s): LIPASE, AMYLASE in the last 168 hours. No results for input(s): AMMONIA in the last 168 hours.  ABG    Component Value Date/Time   PHART 7.419 11/26/2023 1535   PCO2ART 41.6 11/26/2023 1535   PO2ART 55 (L) 11/26/2023 1535   HCO3 26.7 11/26/2023 1535   TCO2 28 11/26/2023 1535   ACIDBASEDEF 1.0 11/25/2023 0136   O2SAT 88 11/26/2023 1535     Coagulation Profile: No results for input(s): INR, PROTIME in the last 168 hours.  Cardiac Enzymes: No results for input(s): CKTOTAL, CKMB, CKMBINDEX, TROPONINI in the last 168 hours.  HbA1C: Hgb A1c MFr Bld  Date/Time Value Ref Range Status  01/29/2016 02:43 PM 8.0 (H) 4.8 - 5.6 % Final    Comment:     (NOTE)         Pre-diabetes: 5.7 - 6.4         Diabetes: >6.4         Glycemic control for adults with diabetes: <7.0     CBG: Recent Labs  Lab 11/26/23 1604 11/26/23 1913 11/26/23 2310 11/27/23 0316 11/27/23 0747  GLUCAP 248* 234* 257* 261* 249*    Review of Systems:   Unable to obtain: AMS  Past Medical History:  He,  has a past medical history of Chronic respiratory failure with hypoxia (HCC), Heart failure (HCC), Pneumonia (1956), and Snake bite (1980).   Surgical History:  History reviewed. No pertinent surgical history.   Social History:   reports that he quit smoking about 42 years ago. His smoking use included cigarettes. He started smoking about 62 years ago. He has a 20 pack-year smoking history. He has never used smokeless tobacco. He reports that he does not drink alcohol  and does not use drugs.   Family History:  His family history includes Asthma in his sister; Heart attack in his father.   Allergies No Known Allergies   Home Medications  Prior to Admission medications   Medication Sig Start Date End Date Taking? Authorizing Provider  aspirin  EC 81 MG tablet Take 81 mg by mouth 4 (four) times a week.    Yes [provider]  Coenzyme Q10 (COQ-10 PO) Take 1 Capful by mouth daily.   Yes [provider]  Multiple Vitamin (MULTIVITAMIN WITH MINERALS) TABS tablet Take 1 tablet by mouth daily.   Yes [provider]     Critical care time: 60 minutes    Christian Nikita Humble AGACNP-BC   [facility] 11/27/2023, 8:11 AM  Please see [email or web address] for pager details.  From 7A-7P if no response, please call [phone]. After hours, please call ELink [phone].

## 2023-11-28 DIAGNOSIS — Z7189 Other specified counseling: Secondary | ICD-10-CM | POA: Diagnosis not present

## 2023-11-28 DIAGNOSIS — G910 Communicating hydrocephalus: Secondary | ICD-10-CM | POA: Diagnosis not present

## 2023-11-28 DIAGNOSIS — Z515 Encounter for palliative care: Secondary | ICD-10-CM | POA: Diagnosis not present

## 2023-11-28 DIAGNOSIS — I480 Paroxysmal atrial fibrillation: Secondary | ICD-10-CM | POA: Diagnosis not present

## 2023-11-28 DIAGNOSIS — J9621 Acute and chronic respiratory failure with hypoxia: Secondary | ICD-10-CM | POA: Diagnosis not present

## 2023-11-28 DIAGNOSIS — S06311A Contusion and laceration of right cerebrum with loss of consciousness of 30 minutes or less, initial encounter: Secondary | ICD-10-CM | POA: Diagnosis not present

## 2023-11-28 DIAGNOSIS — S06310A Contusion and laceration of right cerebrum without loss of consciousness, initial encounter: Secondary | ICD-10-CM | POA: Diagnosis not present

## 2023-11-28 LAB — COMPREHENSIVE METABOLIC PANEL WITH GFR
ALT: 51 U/L — ABNORMAL HIGH (ref 0–44)
AST: 26 U/L (ref 15–41)
Albumin: 2.6 g/dL — ABNORMAL LOW (ref 3.5–5.0)
Alkaline Phosphatase: 53 U/L (ref 38–126)
Anion gap: 10 (ref 5–15)
BUN: 33 mg/dL — ABNORMAL HIGH (ref 8–23)
CO2: 29 mmol/L (ref 22–32)
Calcium: 8.9 mg/dL (ref 8.9–10.3)
Chloride: 112 mmol/L — ABNORMAL HIGH (ref 98–111)
Creatinine, Ser: 1.2 mg/dL (ref 0.61–1.24)
GFR, Estimated: >60 mL/min (ref >60)
Glucose, Bld: 209 mg/dL — ABNORMAL HIGH (ref 70–99)
Potassium: 3.7 mmol/L (ref 3.5–5.1)
Sodium: 151 mmol/L — ABNORMAL HIGH (ref 135–145)
Total Bilirubin: 2.1 mg/dL — ABNORMAL HIGH (ref 0.0–1.2)
Total Protein: 6.1 g/dL — ABNORMAL LOW (ref 6.5–8.1)

## 2023-11-28 LAB — GLUCOSE, CAPILLARY
Glucose-Capillary: 188 mg/dL — ABNORMAL HIGH (ref 70–99)
Glucose-Capillary: 198 mg/dL — ABNORMAL HIGH (ref 70–99)
Glucose-Capillary: 185 mg/dL — ABNORMAL HIGH (ref 70–99)
Glucose-Capillary: 176 mg/dL — ABNORMAL HIGH (ref 70–99)
Glucose-Capillary: 221 mg/dL — ABNORMAL HIGH (ref 70–99)
Glucose-Capillary: 202 mg/dL — ABNORMAL HIGH (ref 70–99)

## 2023-11-28 LAB — CBC
HCT: 58.5 % — ABNORMAL HIGH (ref 39.0–52.0)
Hemoglobin: 19.4 g/dL — ABNORMAL HIGH (ref 13.0–17.0)
MCH: 31.8 pg (ref 26.0–34.0)
MCHC: 33.2 g/dL (ref 30.0–36.0)
MCV: 95.9 fL (ref 80.0–100.0)
Platelets: 123 K/uL — ABNORMAL LOW (ref 150–400)
RBC: 6.1 MIL/uL — ABNORMAL HIGH (ref 4.22–5.81)
RDW: 13.5 % (ref 11.5–15.5)
WBC: 11.3 K/uL — ABNORMAL HIGH (ref 4.0–10.5)
nRBC: 0 % (ref 0.0–0.2)

## 2023-11-28 LAB — CULTURE, RESPIRATORY W GRAM STAIN: Culture: NORMAL

## 2023-11-28 LAB — HCV RNA DIAGNOSIS, NAA: HCV RNA, Quantitation: NOT DETECTED [IU]/mL

## 2023-11-28 LAB — PHOSPHORUS: Phosphorus: 2.5 mg/dL (ref 2.5–4.6)

## 2023-11-28 MED ORDER — POTASSIUM CHLORIDE 20 MEQ PO PACK
40.0000 meq | PACK | Freq: Once | ORAL | Status: AC
  Administered 2023-11-28: 40 meq
  Filled 2023-11-28: qty 2

## 2023-11-28 MED ORDER — HALOPERIDOL LACTATE 5 MG/ML IJ SOLN
5.0000 mg | Freq: Four times a day (QID) | INTRAMUSCULAR | Status: DC | PRN
  Administered 2023-11-28: 5 mg via INTRAVENOUS
  Filled 2023-11-28: qty 1

## 2023-11-28 MED ORDER — FREE WATER
100.0000 mL | Status: DC
  Administered 2023-11-28 – 2023-11-29 (×6): 100 mL

## 2023-11-28 NOTE — Progress Notes (Signed)
    Providing Compassionate, Quality Care - Together   NEUROSURGERY PROGRESS NOTE     S: code status changed to DNR/DNI   O: EXAM:  BP (!) 151/87   Pulse 81   Temp (!) 101.2 F (38.4 C) (Axillary)   Resp (!) 24   Ht 5' 6 (1.676 m)   Wt 79.6 kg   SpO2 96%   BMI 28.32 kg/m     Intubated, opens eyes to command FC RUE, BLE L hemiparetic PERRLA EVD draining at 10cm H20, blood tinged/CSF   ASSESSMENT:  73 y.o. with thalamic hemorrhage, intraventricular hemorrhage, hydrocephalus improved s/p placement of EVD 8/12    PLAN: -Continue EVD at 10cm H20. Plan to drain through weekend and wean EVD next week -Continue supportive care per primary team, appreciate mgmt.  -Call w/ questions/concerns.

## 2023-11-28 NOTE — Progress Notes (Signed)
 Chaplain responded to spiritual consult request for prayer. No family bedside at this time. I provided prayer and a blessing for the patient Zachary Brown, who was unresponsive. Chaplains remain available if and whenever needed.

## 2023-11-28 NOTE — Progress Notes (Signed)
 Nutrition Follow-up  DOCUMENTATION CODES:   Non-severe (moderate) malnutrition in context of acute illness/injury  INTERVENTION:   Continue tube feeding via Cortrak tube:  Osmolite 1.5 at 55 ml/h (1200 ml per day)   Prosource 60mL daily   Provides 2060 kcal, 102 gm protein, 1005 ml free water  daily  100 ml free water  every 4 hours Total free water : 1605 ml   NUTRITION DIAGNOSIS:   Moderate Malnutrition related to acute illness (ICH) as evidenced by energy intake < or equal to 50% for > or equal to 5 days, mild muscle depletion. Ongoing.   GOAL:   Patient will meet greater than or equal to 90% of their needs Met with TF at goal.   MONITOR:   TF tolerance  REASON FOR ASSESSMENT:   Consult Enteral/tube feeding initiation and management  ASSESSMENT:   Pt with hx of heart failure, diabetes, and chronic respiratory failure. Admitted to [facility] from [facility] after pt found down by daughter w/ L sided weakness and numbness, diagnosed R sided basal ganglia IPH.   Pt extubated on 5L O2 via [place] but failed EVD clamping trial. Noted new fever. Free water  increased for elevated Na.   8/8 admitted to 4N ICU 8/10 transferred to progressive unit 8/11 transferred back to ICU; 22M 8/12 - tx back to 4N and intubated 8/13 - s/p cortrak placement; tip gastric  8/14 - extubated   Medications reviewed and include:  Pepcid , SSI every 4 hours, 10 units semglee  BID, miralax , thiamine   40 mEq KCl x 1    Labs reviewed:  Na 161 A1C 6.3 CBG's: 138-308  EVD 191 ml   Current weight: 79.6 kg Admission weight: 80.1 kg    Diet Order:   Diet Order             Diet NPO time specified  Diet effective now                   EDUCATION NEEDS:   Not appropriate for education at this time  Skin:  Skin Assessment: Reviewed RN Assessment  Last BM:  8/15 type 4; medium  Height:   Ht Readings from Last 1 Encounters:  11/27/23 5' 6 (1.676 m)    Weight:   Wt Readings from Last  1 Encounters:  11/28/23 79.6 kg    Ideal Body Weight:  64.5 kg  BMI:  Body mass index is 28.32 kg/m.  Estimated Nutritional Needs:   Kcal:  1900-2100  Protein:  85-105g  Fluid:  1.9-2.1L  Zachary Warr P., RD, LDN, CNSC See AMiON for contact information

## 2023-11-28 NOTE — Progress Notes (Signed)
 PT Cancellation Note  Patient Details Name: Zachary Brown MRN: [id number] DOB: 11-28-1950   Cancelled Treatment:    Reason Eval/Treat Not Completed: Patient not medically ready - pt with RR up to 40s breaths/min upon PT arrival to room, per RN family planning meeting later this date. PT To check back at a later date.   Ezechiel Stooksbury S, PT DPT Acute Rehabilitation Services Secure Chat Preferred  Office [phone]    Renda Pohlman E Stroup 11/28/2023, 2:00 PM

## 2023-11-28 NOTE — Progress Notes (Signed)
 NAME:  Zachary Brown, MRN:  [id number], DOB:  1951-01-31, LOS: 7 ADMISSION DATE:  11/21/2023, CONSULTATION DATE:  11/21/23  REFERRING MD:  Dr. Raford ER, CHIEF COMPLAINT:  ICH   History of Present Illness:  73 year old male with PMH as below who presented to [facility] ED 8/8 after being found on the floor by his daughter. He reports having possibly falling although he does not remember the events surrounding the incident, but feels as though he did not hit his head. He estimates being on the floor for approximately three hours prior to being found by his daughter. ED documentation reports the patient walking and left leg growing weak as well as Left arm numbness prior to fall. . Other areas on injury include the left forearm. Upon arrival to the ED he was noted to have left sided weakness and was immediatly taken for CT of the head. Found to have right sided basal ganglia IPH with intraventricular extension. He was started on nicardipine infusion for blood pressure control . Neurosurgery at [facility] was consulted and recommended transfer for neuro ICU admission. PCCM accepted the patient for transfer.   11/25/23 - patient transferred to the ICU post head CT earlier after acute mental status changes, emergently intubated. Discussed with Neurologist bedside, planning for EVD placement per Neurosurgeon.  Pertinent  Medical History   Past Medical History:  Diagnosis Date   Chronic respiratory failure with hypoxia (HCC)    Heart failure (HCC)    Pneumonia 1956   severe   Snake bite 1980   copper heaed   Significant Hospital Events: Including procedures, antibiotic start and stop dates in addition to other pertinent events   8/12- transfer to ICU for airway support and EVD placement 8/13- Intermittent command following, weaning on vent, will plan to extubate after cortrak placment  8/14 Initially patient struggled with increased secretions but was able to tolerate extubation in the afternoon,  empiric Zosyn  started 8/15 no acute issues overnight, remains on low-dose Precedex   Interim History / Subjective:  Patient is resting in bed on low-dose Precedex  this a.m. able to withdraw from pain EVD in place  Objective    Blood pressure (!) 151/87, pulse 81, temperature (!) 101.2 F (38.4 C), temperature source Axillary, resp. rate (!) 24, height 5' 6 (1.676 m), weight 79.6 kg, SpO2 96%.    Vent Mode: PSV;CPAP FiO2 (%):  [40 %] 40 % PEEP:  [5 cmH20] 5 cmH20 Pressure Support:  [5 cmH20] 5 cmH20   Intake/Output Summary (Last 24 hours) at 11/28/2023 0746 Last data filed at 11/28/2023 0700 Gross per 24 hour  Intake 2291.09 ml  Output 2441 ml  Net -149.91 ml   Filed Weights   11/26/23 0500 11/27/23 0500 11/28/23 0500  Weight: 81.8 kg 81.4 kg 79.6 kg    Examination: General: Acute on chronic ill-appearing elderly male lying in bed in no acute distress  HEENT: Laura/AT, MM pink/moist, PERRL, EVD in place Neuro: Sleepy on Precedex  but will arouse to physical stimuli, withdraws to pain CV: s1s2 regular rate and rhythm, no murmur, rubs, or gallops,  PULM:  Clear to auscultation, slightly diminished, no increased work of breathing, on 5 L nasal cannula GI: soft, bowel sounds active in all 4 quadrants, non-tender, non-distended, tolerating TF Extremities: warm/dry, no edema  Skin: no rashes or lesions  Resolved problem list  AKI  Assessment and Plan  Intraparenchymal hemorrhage -Right basal ganglia with extension into the right lateral and third ventricles. Concern for  hypertensive bleed based on location, but did fall and he has scalp soft tissue injury.  Intraventricular hemorrhage Subarachnoid hemorrhage -11/24/23:repeat head CT reviewed, showing slight worsening of ICH with ventricular extension - EVD placed  -8/12 repeat scan nterval placement of a left frontal approach EVD with tip in the left lateral ventricle. Persistent hydrocephalus, slightly improved. Relatively stable  size intraparenchymal hemorrhage centered at the superior right thalamus. Surrounding vasogenic edema with trace localized right-to-left shift, stable.Scattered subarachnoid hemorrhage involving both cerebralhemispheres, overall slightly decreased. No other new acute intracranial abnormality. P: Primary management per neurology/neurosurgery Neuroprotective measures SBP less than 160 EVD per neurosurgery Frequent neurochecks PT/OT/SLP as able   Acute on chronic hypoxic respiratory failure requiring vent for airway protection secondary to above  -Intubated on 11/25/23, extubated 8/14 Possible ILD -Started workup in pulm office 2017 but lost to follow up.  Has not had access to his home oxygen, concentrator has been broken. Former baseline 2 L/min although he has not used the oxygen for an extended time as it has not been available. -Ct Chest high resolution on 8/11- Pulmonary parenchymal pattern of interstitial lung disease, as detailed above, may be due to fibrotic hypersensitivity pneumonitis or fibrotic nonspecific interstitial pneumonitis.  Concern for aspiration pneumonia  -8/13-patient failed weaning trial and developed distress on ventilator/hypoxia.  Having thick tan inline tracheal secretions-tracheal aspirate sent and chest x-ray obtained showing-right consolidation/opacity in the hemithorax P:  Monitor airway protection in ICU Encourage frequent pulmonary hygiene Aspiration precautions Continue empiric Zosyn  NTS suctioning as needed Head of bed elevated 30 degrees  Hx HFpEF -11/22/23- EF 60 to 65% with regional wall abnormalities, grade 1 diastolic dysfunction, RV mildy dilated, RV mildly reduced  ? Syncope Secondary PAH based on echocardiogram -Suspect that PAH is due to noncompliance with oxygen over the last several years.  Further workup as an outpatient appropriate including screening for VTE, OSA if clinical suspicion, etc. Still 858.6 mL positive since  admission P: Continuous telemetry Continue Norvasc  As needed IV antihypertensives Goal of euvolemia, diurese as needed  Elevated bilirubin Mild elevation in LFTS- AST 50, ALT 62  -8/8 US  Limited RUQ- no gall stones or wall thickening, normal US   Total bili > 3.8 > 3.1 > 2.1 -Hepatitis panel-overall negative, low hepatitis B surface antibody-not consistent with immunity Hep C the RNA test still pending P: Intermittently trend LFTs Avoid hepatotoxins Ensure adequate perfusion  Erythrocytosis  Elevated hemoglobin  Leukocytosis  -Per discussion with daughter, oxygen runs between mid 80s to low 90s, not sure how often this is measured, and when this has been evaluated during periods of exertion vs resting  Patient has not been wearing oxygen for sometime per report, suspect this is related to chronic hypoxemia but cannot fully rule out primary erythrocytosis  Hgb 21.3>> 20.2>>19.0>19.1 WBC trending down- 18.4>13.0> 11.5  P: Trend CBC Hemoglobin goal greater than 7 Consider hematology/oncology consult when medically stable  DM -Unclear whether he was taking metformin  as per his medication list. P: Continue assistance scale SSI Continue to be coverage Continue long-acting insulin  CBG goal 140-180 CBG checks every 4 Hold home metformin    Best Practice (right click and Reselect all SmartList Selections daily)   Diet/type: NPO- place cortrak  DVT prophylaxis SCD Pressure ulcer(s): N/A GI prophylaxis: H2B Lines: N/A Foley:  Plan to insert Code Status:  full code Last date of multidisciplinary goals of care discussion [11/26/23]  Critical care time:   CRITICAL CARE Performed by: Vaani Morren D. Harris   Total critical care  time: 38 minutes  Critical care time was exclusive of separately billable procedures and treating other patients.  Critical care was necessary to treat or prevent imminent or life-threatening deterioration.  Critical care was time spent personally by  me on the following activities: development of treatment plan with patient and/or surrogate as well as nursing, discussions with consultants, evaluation of patient's response to treatment, examination of patient, obtaining history from patient or surrogate, ordering and performing treatments and interventions, ordering and review of laboratory studies, ordering and review of radiographic studies, pulse oximetry and re-evaluation of patient's condition.  Nyx Keady D. Harris, NP-C [facility] Personal contact information can be found on Amion  If no contact or response made please call [phone] 11/28/2023, 7:56 AM

## 2023-11-28 NOTE — Consult Note (Signed)
 Palliative Care Consult Note                                  Date: 11/28/2023   Patient Name: Zachary Brown  DOB: 07-Oct-1950  MRN: [id number]  Age / Sex: 73 y.o., male  PCP: Zachary Dirks, MD Referring Physician: Stroke, Md, MD  Reason for Consultation: Establishing goals of care  HPI/Patient Profile: 73 y.o. male  with past medical history of asbestosis, chronic respiratory failure on 2L HOT (machine broke, so not currently using oxygen), DM, and heart failure  admitted on 11/21/2023 after a fall and left sided weakness.   Head CT revealed acute right thalamic intraparenchymal hemorrhage with IVH, complicated with hydrocephalus status post EVD placement on 8/12.    Patient and family faces treatment option decisions, advanced directive decisions, and anticipatory care needs.  Past Medical History:  Diagnosis Date   Chronic respiratory failure with hypoxia (HCC)    Heart failure (HCC)    Pneumonia 1956   severe   Snake bite 1980   copper heaed    Subjective:   I have reviewed medical records including EPIC notes, labs and imaging, received updates from nursing and CCM, assessed the patient and then spoke by phone with patient's daughter Zachary to discuss diagnosis prognosis, GOC, EOL wishes, disposition and options.  I introduced Palliative Medicine as specialized medical care for people living with serious illness. It focuses on providing relief from symptoms and stress of a serious illness. The goal is to improve quality of life for both the patient and the family.  Today's Discussion: Spoke to patient's daughter Zachary by phone.  Zachary has a good understanding of the patient's chronic conditions and current hospitalization.  We discussed the patient's right thalamic intraparenchymal hemorrhage with IVH complicated by hydrocephalus s/p EVD. Discussed patient's extubation yesterday. She shared she is cautiously optimistic with  his small improvements but also knows how fickle the brain can be. She understands the EVD will still need to be weaned and that her father is deconditioned/frail.   Prior to this admission the patient lived independently at home.  He was retired but would joke with his daughter about how busy his life was. He enjoyed staying very active as he ran errands around town and working in his garden.  His childhood was difficult. He gained a deep sense of independence after losing both parents by the time he was 17. He is spiritual and believes in the power of prayer- chaplain consulted.  We discussed advanced directives. Zachary shared she changed the patient to DNR/DNI yesterday before the extubation. Quality of life is more important to the patient than quantity of life. The patient would not want a tracheostomy. He had told her that he has had a full life and that when it is his time to go he is ready. Zachary would not want to watch him living a life that was not meaningful to him. She believes that if anybody could beat the odds and have a meaningful recovery it is her father. She wants time for improvement/outcomes but if he decompensates or if it looks like he will not be able to have a meaningful recovery then she would likely transition to comfort measures.   Discussed the importance of continued conversation with family and the medical providers regarding overall plan of care and treatment options, ensuring decisions are within the context of the patient's values and  GOCs.  Questions and concerns were addressed. The family was encouraged to call with questions or concerns. PMT will continue to support holistically.  Review of Systems  Unable to perform ROS   Objective:   Primary Diagnoses: Present on Admission:  Intraparenchymal hematoma of brain (HCC)  ILD (interstitial lung disease) (HCC)  Pulmonary emphysema (HCC)  Acute on chronic respiratory failure with hypoxia [facility])   Physical  Exam Vitals reviewed.  Constitutional:      General: He is sleeping. He is not in acute distress.    Appearance: He is ill-appearing.  Cardiovascular:     Rate and Rhythm: Normal rate.  Pulmonary:     Effort: Tachypnea present.     Comments: secretions Skin:    General: Skin is warm and dry.     Vital Signs:  BP 124/71   Pulse 79   Temp 99.7 F (37.6 C) (Axillary)   Resp (!) 42   Ht 5' 6 (1.676 m)   Wt 79.6 kg   SpO2 94%   BMI 28.32 kg/m   Palliative Assessment/Data: 30%    Advanced Care Planning:   Existing Vynca/ACP Documentation: None  Primary Decision Maker: NEXT OF KIN. While patient is unable to make decisions his daughter Zachary Brown is proxy decision maker.  Code Status/Advance Care Planning: DNR  Assessment & Plan:   SUMMARY OF RECOMMENDATIONS   Continue DNR/DNI Allow time for outcomes If patient decompensates daughter will likely transition to comfort Spiritual care consult Continued PMT support   Discussed with: Zachary Brown CCM NP and bedside RN  Time Total: 75 minutes    Thank you for allowing us  to participate in the care of Zachary Brown PMT will continue to support holistically.   Signed by: Zachary Palin, NP Palliative Medicine Team  Team Phone # [phone] (Nights/Weekends)  11/28/2023, 1:50 PM

## 2023-11-28 NOTE — Progress Notes (Addendum)
 STROKE TEAM PROGRESS NOTE   INTERIM HISTORY/SUBJECTIVE No family at the bedside.  Patient was extubated yesterday and so far maintaining his airway.  EVD is open at 10 Phosphorus 2.2, Tmax 101.2, WBC 11.3, sodium 151 will start free water  flushes  CBC    Component Value Date/Time   WBC 11.3 (H) 11/28/2023 0600   RBC 6.10 (H) 11/28/2023 0600   HGB 19.4 (H) 11/28/2023 0600   HCT 58.5 (H) 11/28/2023 0600   PLT 123 (L) 11/28/2023 0600   MCV 95.9 11/28/2023 0600   MCH 31.8 11/28/2023 0600   MCHC 33.2 11/28/2023 0600   RDW 13.5 11/28/2023 0600   LYMPHSABS 1.7 11/27/2023 0942   MONOABS 0.9 11/27/2023 0942   EOSABS 0.0 11/27/2023 0942   BASOSABS 0.0 11/27/2023 0942    BMET    Component Value Date/Time   NA 151 (H) 11/28/2023 0600   K 3.7 11/28/2023 0600   CL 112 (H) 11/28/2023 0600   CO2 29 11/28/2023 0600   GLUCOSE 209 (H) 11/28/2023 0600   BUN 33 (H) 11/28/2023 0600   CREATININE 1.20 11/28/2023 0600   CALCIUM 8.9 11/28/2023 0600   GFRNONAA >60 11/28/2023 0600    IMAGING past 24 hours No results found.    Vitals:   11/28/23 0900 11/28/23 0958 11/28/23 1000 11/28/23 1120  BP:  (!) 152/91 (!) 152/91   Pulse: 72 76 79 88  Resp: (!) 22 (!) 23 (!) 30 (!) 24  Temp:      TempSrc:      SpO2: 100% 96% 95% 91%  Weight:      Height:         PHYSICAL EXAM General:  Critically ill elderly male  CV: Regular rate and rhythm on monitor Respiratory: slightly labored on [place]  NEURO:  Mental Status:   Somnolent, but opens eyes to pain.  Aphasic does  follow commands few simple midline commands intermittently..   Cranial Nerves:  II: Pupils sluggish bilaterally. III, IV, VI: Does not track examiner. V: Sensation is intact to light touch and symmetrical to face.  VII: symmetric IX, X: Weak cough and gag present.  Motor:/Sensory: Purposeful RUE, BLE Minor withdraw LUE    ASSESSMENT/PLAN  [name] is a 73 y.o. male with history of COPD, heart failure, presents  with left-sided weakness that started abruptly   NIH on Admission 5  Intracerebral Hemorrhage and IVH  right thalamus, multifocal subarachnoid hemorrhage  Etiology: Likely hypertensive CT head Stable 3 cm hemorrhage in the right thalamus and intraventricular hemorrhage, right greater than left. 2. New multifocal subarachnoid hemorrhage bilaterally, most prominent over the left parietal lobe, right frontal and perculum, and in the occipital lobes bilaterally. CTA head & neck no LVO Overnight CTH 8/12 (due to mental status decline) 3.8 x 2.3 x 3.9 cm intraparenchymal hemorrhage centered at the superior right thalamus, relatively stable from prior MRI. Surrounding vasogenic edema, mildly progressed. Trace right-to-left shift at the septum pellucidum. Intraventricular extension with blood in both lateral ventricles as well as the third ventricle. Increased lateral and third ventriculomegaly since previous, compatible with progressive hydrocephalus. Scattered subarachnoid hemorrhage involving both cerebral hemispheres, relatively similar to prior 8/13 CT Head: Ordered MRI 8/10  4.0 x 2.4 x 3.8 cm intraparenchymal hemorrhage centered at the superior right thalamus, slightly increased in size from previous (estimated volume 18 mL, previously 12.5 mL). Intraventricular extension with blood in the lateral and third ventricles, similar. Stable ventricular size and morphology without progressive hydrocephalus. 2D Echo ejection  fraction 60 to 65%.  Left atrial size normal. LDL 47 HgbA1c 6.3 VTE prophylaxis -SCDs aspirin  81 mg daily prior to admission, continue No antithrombotic due to acute hemorrhage Therapy recommendations:  Pending Disposition: Pending  Obstructive Hydrocephalus EVD in place Draining approx 53ml/hr, serosang Managed by neurosurgery  Acute on chronic respiratory failure with hypoxia Aspiration Pneumonia  extubated 8/14 CCM management, appreciate assistance. CT Chest 8/11:  Pulmonary parenchyma pattern of interstitial lung disease, questionably due to fibrotic hypersensitive pneumonitis or fibrotic nonspecific interstitial pneumonitis CXR 8/12: Stable multifocal pulmonary infiltrates On Zosyn  8/13-  Hypertension HFp EF Home meds: None Started on Norvasc  5 mg, losartan  50 mg Beta-blocker as needed to maintain BP goal Blood Pressure Goal: SBP between 130-150 for 24 hours and then less than 160   Hyperlipidemia Home meds: None LDL 47 goal < 70 High intensity statin not indicated due to LDL within goal  DM Home meds: Metformin  A1c: 6.3 CBG, SSI  Dysphagia Patient has post-stroke dysphagia, SLP consulted    Diet   Diet NPO time specified   Start tube feeds, nutrition consult  Hypernatremia  Hypophosphatemia  NA 151 -start FWF 100 cc Q4h Phos 2.2-2.5  replaced  Recheck in am   Other Stroke Risk Factors Congestive heart failure  Other Active Problems AKI, Cr 0.91--1.36 Continue strict I/O, daily labs Leukocytosis-WBC 12.5-11.3, T max 101.2 Polycythemia-Hgb 19.4, HCT 56    Zachary Geralds DNP, ACNPC-AG  [facility]  I have personally obtained history,examined this patient, reviewed notes, independently viewed imaging studies, participated in medical decision making and plan of care.ROS completed by me personally and pertinent positives fully documented  I have made any additions or clarifications directly to the above note. Agree with note above.  Patient was extubated yesterday and so far is maintaining his airway however exam remains poor with patient drowsy and following only few occasional midline and one-step commands.  Continues to have f significant left-sided weakness.  Ventriculostomy is draining well.  Continue ventriculostomy drainage as per neurosurgery and hopefully removal next week.  No family available at the bedside for discussion.  Discussed with Dr. Harold critical care medicine This patient is critically ill and at  significant risk of neurological worsening, death and care requires constant monitoring of vital signs, hemodynamics,respiratory and cardiac monitoring, extensive review of multiple databases, frequent neurological assessment, discussion with family, other specialists and medical decision making of high complexity.I have made any additions or clarifications directly to the above note.This critical care time does not reflect procedure time, or teaching time or supervisory time of PA/NP/Med Resident etc but could involve care discussion time.  I spent 30 minutes of neurocritical care time  in the care of  this patient.     Eather Popp, MD Medical Director [facility] Pager: [phone] 11/28/2023 2:38 PM

## 2023-11-28 NOTE — TOC Progression Note (Signed)
 Transition of Care [facility]) - Progression Note    Patient Details  Name: Zachary Brown MRN: [id number] Date of Birth: 08/04/1950  [facility] CM/SW Contact  Inocente GORMAN Kindle, LCSW Phone Number: 11/28/2023, 10:17 AM  Clinical Narrative:    CSW continuing to follow. Patient extubated yesterday.    Expected Discharge Plan: IP Rehab Facility                 Expected Discharge Plan and Services                                               Social Drivers of Health (SDOH) Interventions SDOH Screenings   Food Insecurity: Unknown (11/21/2023)  Housing: Unknown (11/21/2023)  Transportation Needs: Unknown (11/21/2023)  Utilities: Not At Risk (11/21/2023)  Social Connections: Unknown (11/21/2023)  Tobacco Use: Medium Risk (11/21/2023)    Readmission Risk Interventions     No data to display

## 2023-11-29 DIAGNOSIS — S06310A Contusion and laceration of right cerebrum without loss of consciousness, initial encounter: Secondary | ICD-10-CM | POA: Diagnosis not present

## 2023-11-29 DIAGNOSIS — I615 Nontraumatic intracerebral hemorrhage, intraventricular: Secondary | ICD-10-CM | POA: Diagnosis not present

## 2023-11-29 DIAGNOSIS — J9621 Acute and chronic respiratory failure with hypoxia: Secondary | ICD-10-CM | POA: Diagnosis not present

## 2023-11-29 DIAGNOSIS — I480 Paroxysmal atrial fibrillation: Secondary | ICD-10-CM | POA: Diagnosis not present

## 2023-11-29 DIAGNOSIS — G911 Obstructive hydrocephalus: Secondary | ICD-10-CM | POA: Diagnosis not present

## 2023-11-29 DIAGNOSIS — Z515 Encounter for palliative care: Secondary | ICD-10-CM | POA: Diagnosis not present

## 2023-11-29 DIAGNOSIS — G910 Communicating hydrocephalus: Secondary | ICD-10-CM | POA: Diagnosis not present

## 2023-11-29 DIAGNOSIS — I618 Other nontraumatic intracerebral hemorrhage: Secondary | ICD-10-CM | POA: Diagnosis not present

## 2023-11-29 DIAGNOSIS — Z7189 Other specified counseling: Secondary | ICD-10-CM | POA: Diagnosis not present

## 2023-11-29 DIAGNOSIS — I609 Nontraumatic subarachnoid hemorrhage, unspecified: Secondary | ICD-10-CM | POA: Diagnosis not present

## 2023-11-29 DIAGNOSIS — S06311A Contusion and laceration of right cerebrum with loss of consciousness of 30 minutes or less, initial encounter: Secondary | ICD-10-CM | POA: Diagnosis not present

## 2023-11-29 LAB — BASIC METABOLIC PANEL WITH GFR
Anion gap: 9 (ref 5–15)
BUN: 34 mg/dL — ABNORMAL HIGH (ref 8–23)
CO2: 28 mmol/L (ref 22–32)
Calcium: 9 mg/dL (ref 8.9–10.3)
Chloride: 113 mmol/L — ABNORMAL HIGH (ref 98–111)
Creatinine, Ser: 1.15 mg/dL (ref 0.61–1.24)
GFR, Estimated: >60 mL/min (ref >60)
Glucose, Bld: 197 mg/dL — ABNORMAL HIGH (ref 70–99)
Potassium: 4.1 mmol/L (ref 3.5–5.1)
Sodium: 150 mmol/L — ABNORMAL HIGH (ref 135–145)

## 2023-11-29 LAB — GLUCOSE, CAPILLARY
Glucose-Capillary: 224 mg/dL — ABNORMAL HIGH (ref 70–99)
Glucose-Capillary: 180 mg/dL — ABNORMAL HIGH (ref 70–99)

## 2023-11-29 LAB — TRIGLYCERIDES: Triglycerides: 31 mg/dL (ref <150)

## 2023-11-29 LAB — MAGNESIUM: Magnesium: 2.3 mg/dL (ref 1.7–2.4)

## 2023-11-29 MED ORDER — MORPHINE BOLUS VIA INFUSION
5.0000 mg | INTRAVENOUS | Status: DC | PRN
  Administered 2023-11-29 – 2023-11-30 (×3): 5 mg via INTRAVENOUS

## 2023-11-29 MED ORDER — ACETAMINOPHEN 650 MG RE SUPP: 650.0000 mg | Freq: Four times a day (QID) | RECTAL | Status: DC | PRN

## 2023-11-29 MED ORDER — GLYCOPYRROLATE 0.2 MG/ML IJ SOLN: 0.2000 mg | INTRAMUSCULAR | Status: DC | PRN

## 2023-11-29 MED ORDER — ACETAMINOPHEN 325 MG PO TABS
650.0000 mg | ORAL_TABLET | Freq: Four times a day (QID) | ORAL | Status: DC | PRN
Start: 2023-11-29 — End: 2023-11-30

## 2023-11-29 MED ORDER — GLYCOPYRROLATE 0.2 MG/ML IJ SOLN
0.2000 mg | INTRAMUSCULAR | Status: DC | PRN
  Administered 2023-11-29 – 2023-11-30 (×3): 0.2 mg via INTRAVENOUS
  Filled 2023-11-29 (×3): qty 1

## 2023-11-29 MED ORDER — GLYCOPYRROLATE 1 MG PO TABS: 1.0000 mg | ORAL_TABLET | ORAL | Status: DC | PRN

## 2023-11-29 MED ORDER — MORPHINE SULFATE (PF) 2 MG/ML IV SOLN
INTRAVENOUS | Status: AC
  Filled 2023-11-29: qty 1

## 2023-11-29 MED ORDER — SODIUM CHLORIDE 0.9 % IV SOLN: INTRAVENOUS | Status: DC

## 2023-11-29 MED ORDER — MORPHINE 100MG IN NS 100ML (1MG/ML) PREMIX INFUSION
0.0000 mg/h | INTRAVENOUS | Status: DC
  Administered 2023-11-29: 5 mg/h via INTRAVENOUS
  Administered 2023-11-29 – 2023-11-30 (×2): 12 mg/h via INTRAVENOUS
  Filled 2023-11-29 (×3): qty 100

## 2023-11-29 MED ORDER — MORPHINE SULFATE (PF) 2 MG/ML IV SOLN
2.0000 mg | INTRAVENOUS | Status: DC | PRN
  Administered 2023-11-29: 2 mg via INTRAVENOUS

## 2023-11-29 MED ORDER — POLYVINYL ALCOHOL 1.4 % OP SOLN: 1.0000 [drp] | Freq: Four times a day (QID) | OPHTHALMIC | Status: DC | PRN

## 2023-11-29 MED ORDER — MIDAZOLAM HCL 2 MG/2ML IJ SOLN: 2.0000 mg | INTRAMUSCULAR | Status: DC | PRN

## 2023-11-29 NOTE — Progress Notes (Addendum)
 NEUROSURGERY PROGRESS NOTE  EVD draining appropriately. No acute changes overnight.   Temp:  [98.6 F (37 C)-102.2 F (39 C)] 102.2 F (39 C) (08/16 0800) Pulse Rate:  [66-118] 110 (08/16 0800) Resp:  [16-45] 28 (08/16 0800) BP: (120-206)/(71-97) 172/92 (08/16 0800) SpO2:  [82 %-100 %] 97 % (08/16 0800) FiO2 (%):  [100 %] 100 % (08/16 0730)   Suzen Chiquita Pean, NP 11/29/2023 9:02 AM

## 2023-11-29 NOTE — Progress Notes (Addendum)
 STROKE TEAM PROGRESS NOTE   INTERIM HISTORY/SUBJECTIVE Patient transitioned to comfort care this morning. Overnight events noted  Family at the bedside.  He is on high flow nasal cannula and a nonrebreather Does not appear to be in distress at the moment  CBC    Component Value Date/Time   WBC 11.3 (H) 11/28/2023 0600   RBC 6.10 (H) 11/28/2023 0600   HGB 19.4 (H) 11/28/2023 0600   HCT 58.5 (H) 11/28/2023 0600   PLT 123 (L) 11/28/2023 0600   MCV 95.9 11/28/2023 0600   MCH 31.8 11/28/2023 0600   MCHC 33.2 11/28/2023 0600   RDW 13.5 11/28/2023 0600   LYMPHSABS 1.7 11/27/2023 0942   MONOABS 0.9 11/27/2023 0942   EOSABS 0.0 11/27/2023 0942   BASOSABS 0.0 11/27/2023 0942    BMET    Component Value Date/Time   NA 150 (H) 11/29/2023 0830   K 4.1 11/29/2023 0830   CL 113 (H) 11/29/2023 0830   CO2 28 11/29/2023 0830   GLUCOSE 197 (H) 11/29/2023 0830   BUN 34 (H) 11/29/2023 0830   CREATININE 1.15 11/29/2023 0830   CALCIUM 9.0 11/29/2023 0830   GFRNONAA >60 11/29/2023 0830    IMAGING past 24 hours No results found.    Vitals:   11/29/23 0645 11/29/23 0700 11/29/23 0730 11/29/23 0800  BP: (!) 168/81 (!) 175/90 (!) 175/90 (!) 172/92  Pulse: (!) 116 (!) 112 (!) 118 (!) 110  Resp: (!) 29 (!) 29 (!) 28 (!) 28  Temp:    (!) 102.2 F (39 C)  TempSrc:    Axillary  SpO2: (!) 82% 91% (!) 88% 97%  Weight:      Height:         PHYSICAL EXAM General:  Critically ill elderly male  CV: Regular rate and rhythm on monitor Respiratory: On high flow nasal cannula and nonrebreather  NEURO:  Mental Status:   Somnolent, not opening eyes to pain.  Aphasic does not following commands  Cranial Nerves:  II: Pupils sluggish bilaterally. III, IV, VI: Does not track examiner. V: Sensation is intact to light touch and symmetrical to face.  VII: symmetric IX, X: Weak cough and gag present.  Motor:/Sensory: Purposeful RUE, BLE Minor withdraw LUE    ASSESSMENT/PLAN  [name] is a 73 y.o. male with history of COPD, heart failure, presents with left-sided weakness that started abruptly   NIH on Admission 5  ICH: Right thalamic ICH with IVH and SAH, etiology: Likely hypertensive CT head Stable 3 cm hemorrhage in the right thalamus and intraventricular hemorrhage, right greater than left. New multifocal subarachnoid hemorrhage bilaterally, most prominent over the left parietal lobe, right frontal and perculum, and in the occipital lobes bilaterally. CTA head & neck no LVO MRI 8/10 slightly increased in size from previous (estimated volume 18 mL, previously 12.5 mL). Intraventricular extension with blood in the lateral and third ventricles, similar. Stable ventricular size and morphology without progressive hydrocephalus. [facility] 8/12 relatively stable from prior MRI. Surrounding vasogenic edema, mildly progressed. Trace right-to-left shift at the septum pellucidum. Intraventricular extension with blood in both lateral ventricles as well as the third ventricle. Increased lateral and third ventriculomegaly since previous, compatible with progressive hydrocephalus. Scattered subarachnoid hemorrhage involving both cerebral hemispheres, relatively similar to prior 8/13 CT Head: Stable hematoma and hydrocephalus 2D Echo EF 60 to 65%.  Left atrial size normal. LDL 47 HgbA1c 6.3 VTE prophylaxis - SCDs aspirin  81 mg daily prior to admission, continue No antithrombotic  due to acute hemorrhage Disposition: Palliative care on board, family decided on comfort care measures  Obstructive Hydrocephalus S/p EVD EVD in place, draining  Managed by neurosurgery CT repeat 8/13 stable hematoma and hydrocephalus  Acute on chronic respiratory failure with hypoxia Aspiration Pneumonia  extubated 8/14 CCM management, appreciate assistance. CT Chest 8/11: Pulmonary parenchyma pattern of interstitial lung disease, questionably due to fibrotic hypersensitive pneumonitis or fibrotic  nonspecific interstitial pneumonitis CXR 8/12: Stable multifocal pulmonary infiltrates Was on Zosyn   Continue to have respiratory distress, on high flow oxygen Family decided comfort care measures after meeting with CCM and palliative care  Hypertension HFp EF Home meds: None Was on Norvasc  5 mg, losartan  50 mg   Dysphagia Patient has post-stroke dysphagia Currently n.p.o. Off tube feeding for comfort care measures  Hypernatremia  Hypophosphatemia  NA 151 - was on FW 100 cc Q4h Phos 2.2-2.5  replaced   Other Stroke Risk Factors Congestive heart failure  Other Active Problems AKI, Cr 0.91--1.36 Leukocytosis-WBC 12.5-11.3, T max 101.2 Polycythemia-Hgb 19.4, HCT 56   Karna Geralds DNP, ACNPC-AG  [facility]  ATTENDING NOTE: I reviewed above note and agree with the assessment and plan. Pt was seen and examined.   Brother and sister in law are at bedside.  Patient still has respiratory distress, on high flow oxygen and facemask, apparently in respiratory distress.  CCM and palliative care has discussed with family and family has decided for comfort care measures.  Will start comfort care order set.  For detailed assessment and plan, please refer to above as I have made changes wherever appropriate.   Ary Cummins, MD PhD Stroke Neurology 11/29/2023 4:05 PM

## 2023-11-29 NOTE — Progress Notes (Signed)
 Patient's family arrived at bedside, they did FaceTime with patient's daughter, everyone is in agreement to proceed with comfort care  Comfort care orders written   Valinda Novas, MD

## 2023-11-29 NOTE — Progress Notes (Signed)
 eLink Physician-Brief Progress Note Patient Name: Gumecindo Hopkin DOB: 06/27/1950 MRN: [id number]   Date of Service  11/29/2023  HPI/Events of Note  73 yr old male with ICH with hypoxia related to secretions and decreased LOC.  NT suctioning has been performed.  On [place] NRB mask added without improvement.  Heated hi flow to be tried.  Patient is DNR/DNI.  Present sat 82%.  eICU Interventions  Agree with plan If unable to improve sat may need to transition to comfort     Intervention Category Major Interventions: Hypoxemia - evaluation and management  CLAUDENE AGENT, P 11/29/2023, 6:41 AM

## 2023-11-29 NOTE — Progress Notes (Signed)
 Rt was called due to pt desatting on salter @ 6L. NTS suctioned. Placed on non-rebreather, sat's 88-90. Pt was placed on a HHFNC @ 50L.

## 2023-11-29 NOTE — Progress Notes (Signed)
 NAME:  Zachary Brown, MRN:  [id number], DOB:  07/15/1950, LOS: 8 ADMISSION DATE:  11/21/2023, CONSULTATION DATE:  11/21/23  REFERRING MD:  Dr. Raford ER, CHIEF COMPLAINT:  ICH   History of Present Illness:  73 year old male with PMH as below who presented to [facility] ED 8/8 after being found on the floor by his daughter. He reports having possibly falling although he does not remember the events surrounding the incident, but feels as though he did not hit his head. He estimates being on the floor for approximately three hours prior to being found by his daughter. ED documentation reports the patient walking and left leg growing weak as well as Left arm numbness prior to fall. . Other areas on injury include the left forearm. Upon arrival to the ED he was noted to have left sided weakness and was immediatly taken for CT of the head. Found to have right sided basal ganglia IPH with intraventricular extension. He was started on nicardipine infusion for blood pressure control . Neurosurgery at [facility] was consulted and recommended transfer for neuro ICU admission. PCCM accepted the patient for transfer.   11/25/23 - patient transferred to the ICU post head CT earlier after acute mental status changes, emergently intubated. Discussed with Neurologist bedside, planning for EVD placement per Neurosurgeon.  Pertinent  Medical History   Past Medical History:  Diagnosis Date   Chronic respiratory failure with hypoxia (HCC)    Heart failure (HCC)    Pneumonia 1956   severe   Snake bite 1980   copper heaed   Significant Hospital Events: Including procedures, antibiotic start and stop dates in addition to other pertinent events   8/12- transfer to ICU for airway support and EVD placement 8/13- Intermittent command following, weaning on vent, will plan to extubate after cortrak placment  8/14 Initially patient struggled with increased secretions but was able to tolerate extubation in the afternoon,  empiric Zosyn  started 8/15 no acute issues overnight, remains on low-dose Precedex   Interim History / Subjective:  Patient became hypoxic earlier this morning, requiring heated high flow nasal cannula oxygen and nonrebreather, still he was desatting in 80s, he was suctioned multiple times with improvement in oxygen saturation but currently he is on 100% heated high flow nasal cannula oxygen and 60 L flow with nonrebreather facemask Spiked low-grade fever with Tmax 100.6  Objective    Blood pressure (!) 175/90, pulse (!) 118, temperature 100 F (37.8 C), temperature source Axillary, resp. rate (!) 28, height 5' 6 (1.676 m), weight 79.6 kg, SpO2 (!) 88%.    FiO2 (%):  [100 %] 100 %   Intake/Output Summary (Last 24 hours) at 11/29/2023 0738 Last data filed at 11/29/2023 0600 Gross per 24 hour  Intake 2162.51 ml  Output 1230 ml  Net 932.51 ml   Filed Weights   11/26/23 0500 11/27/23 0500 11/28/23 0500  Weight: 81.8 kg 81.4 kg 79.6 kg    Examination: General: Acute on chronically ill-appearing male, lying on the bed, looks in respiratory distress HEENT: [place]/AT, eyes anicteric.  moist mucus membranes.  EVD in place at 10 cm water  Neuro: Awake, not following commands, plegic on left side, antigravity on right side Chest: Bilateral crackles all over, no wheezes Heart: Regular rate and rhythm, no murmurs or gallops Abdomen: Soft, nontender, nondistended, bowel sounds present  Labs and images reviewed  Patient Lines/Drains/Airways Status     Active Line/Drains/Airways     Name Placement date Placement time Site Days  Peripheral IV 11/25/23 18 G Anterior;Proximal;Right Forearm 11/25/23  0015  Forearm  4   Peripheral IV 11/26/23 22 G Anterior;Right;Upper Arm 11/26/23  1659  Arm  3   External Urinary Catheter 11/25/23  0700  --  4   ICP/Ventriculostomy Ventricular drainage catheter Left Parietal region 11/25/23  0040  Parietal region  4   Small Bore Feeding Tube 10 Fr. Left nare  Marking at nare/corner of mouth 75 cm 11/26/23  0938  Left nare  3   Wound 11/25/23 Other (Comment) Head Left;Upper 11/25/23  --  Head  4   Wound 11/28/23 11/28/23  --  --  1        Resolved problem list  AKI  Assessment and Plan  Acute right thalamic intraparenchymal hemorrhage with IVH, ICH score 2 Subarachnoid hemorrhage in left temporal region Obstructive hydrocephalus status post EVD in place Acute respiratory failure with hypoxia Recurrent aspiration pneumonia Possible ILD AKI due to dehydration Diabetes type 2 Hypophosphatemia Hypernatremia Thrombocytopenia critical illness HFpEF was ruled out Chronic cor pulmonale  Continue neuro watch EVD in place, currently on 10 cm of water  and draining bloody fluid Avoid antiplatelet and anticoagulation Patient started struggling with breathing, he is desatting to 80s, he was suctioned multiple times He is constantly aspirating Spoke with patient's daughter, she stated wait for few family members to come as she cannot come to the hospital, due to COVID.  Once family member arrives, will start comfort care Will give him 2 mg of morphine  now Avoid labs   Best Practice (right click and Reselect all SmartList Selections daily)   Diet/type: NPO- place cortrak  DVT prophylaxis SCD Pressure ulcer(s): N/A GI prophylaxis: H2B Lines: N/A Foley:  Code Status: DNR/DNI Last date of multidisciplinary goals of care discussion : 8/16 spoke with patient's daughter, as patient is aspirating, patient's family would not like him to get reintubated, they recommend once family arrives proceed with comfort care  The patient is critically ill due to acute right intraparenchymal hemorrhage/acute respiratory failure with hypoxia/aspiration pneumonia.  Critical care was necessary to treat or prevent imminent or life-threatening deterioration.  Critical care was time spent personally by me on the following activities: development of treatment plan  with patient and/or surrogate as well as nursing, discussions with consultants, evaluation of patient's response to treatment, examination of patient, obtaining history from patient or surrogate, ordering and performing treatments and interventions, ordering and review of laboratory studies, ordering and review of radiographic studies, pulse oximetry, re-evaluation of patient's condition and participation in multidisciplinary rounds.   During this encounter critical care time was devoted to patient care services described in this note for 46 minutes.     Valinda Novas, MD [facility] See Amion for pager If no response to pager, please call [phone] until 7pm After 7pm, Please call E-link [phone]

## 2023-11-29 NOTE — Progress Notes (Addendum)
 Daily Progress Note   Patient Name: Zachary Brown       Date: 11/29/2023 DOB: 12/28/1950  Age: 73 y.o. MRN#: [id number] Attending Physician: Stroke, Md, MD Primary Care Physician: Caleen Dirks, MD Admit Date: 11/21/2023  Reason for Consultation/Follow-up: Establishing goals of care  Length of Stay: 8  Current Medications: Scheduled Meds:   Chlorhexidine  Gluconate Cloth  6 each Topical Q0600   mouth rinse  15 mL Mouth Rinse Q2H    Continuous Infusions:  sodium chloride  20 mL/hr at 11/29/23 1018   morphine  5 mg/hr (11/29/23 1028)    PRN Meds: acetaminophen  **OR** acetaminophen , artificial tears, bisacodyl , glycopyrrolate  **OR** glycopyrrolate  **OR** glycopyrrolate , midazolam , morphine , mouth rinse  Physical Exam Vitals reviewed.  Constitutional:      General: He is awake. He is not in acute distress.    Appearance: He is ill-appearing.  Pulmonary:     Effort: Tachypnea present.  Skin:    General: Skin is warm and dry.             Vital Signs: BP 139/78   Pulse (!) 104   Temp (!) 102.2 F (39 C) (Axillary)   Resp (!) 32   Ht 5' 6 (1.676 m)   Wt 79.6 kg   SpO2 100%   BMI 28.32 kg/m  SpO2: SpO2: 100 % O2 Device: O2 Device: NRB, Heated High Flow Nasal Cannula O2 Flow Rate: O2 Flow Rate (L/min): 60 L/min       Palliative Assessment/Data: 20%      Patient Active Problem List   Diagnosis Date Noted   Malnutrition of moderate degree 11/25/2023   Intraparenchymal hematoma of brain (HCC) 11/21/2023   Diabetes (HCC) 01/30/2016   Pulmonary emphysema (HCC)    Acute on chronic respiratory failure with hypoxia (HCC)    Hypoxia    ILD (interstitial lung disease) (HCC)    Acute hypoxemic respiratory failure (HCC) 01/21/2016    Palliative Care Assessment & Plan    Patient Profile: 73 y.o. male  with past medical history of asbestosis, chronic respiratory failure on 2L HOT (machine broke, so not currently using oxygen), DM, and heart failure  admitted on 11/21/2023 after a fall and left sided weakness.    Head CT revealed acute right thalamic intraparenchymal hemorrhage with IVH, complicated with hydrocephalus status post EVD placement on  8/12.     Patient and family faces treatment option decisions, advanced directive decisions, and anticipatory care needs.  Today's discussion: Reviewed chart and received update from nursing. Patient hypoxic this morning. Required non rebreather then 100% heated high flow nasal cannula. Required frequent suctioning. CCM discussed decline in patient's respiratory status with daughter and patient was transitioned to comfort measures.  Patient lying in bed with brother Daril and sister-in-law at bedside. Patient looks uncomfortable as his mouth is very dry- mouth care provided by RN. His breathing is tachypneic but he is not struggling to breath. Ricky shared stories of the patient's life. Daril shared that the patient had a difficult life but was getting ready to go to heaven where he will no longer suffer. We discussed comfort measures. We discussed that the patient would no longer receive aggressive medical interventions such as continuous vital signs, lab work, radiology testing, or medications not focused on comfort. All care would focus on how the patient is looking and feeling. This would include management of any symptoms that may cause discomfort, pain, shortness of breath, cough, nausea, agitation, anxiety, and/or secretions etc. Symptoms would be managed with medications and other non-pharmacological interventions. Family verbalized understanding and appreciation. Encouraged family to spend time/talk to patient.  Spoke to patient's daughter Delon Saajan Willmon. She was understandably upset. She has Covid and is unable to come  to the hospital. Reassured her that patient would be kept comfortable in his end of life. Discussed medications available for comfort. Emotional support and therapeutic listening provided. Encouraged family to call PMT with questions or concerns.   Recommendations/Plan: Full comfort care Symptom management managed by primary team Continued PMT support   Code Status:    Code Status Orders  (From admission, onward)           Start     Ordered   11/29/23 0902  Do not attempt resuscitation (DNR) - Comfort care  Continuous       Question Answer Comment  If patient has no pulse and is not breathing Do Not Attempt Resuscitation   In Pre-Arrest Conditions (Patient Is Breathing and Has a Pulse) Provide comfort measures. Relieve any mechanical airway obstruction. Avoid transfer unless required for comfort.   Consent: Discussion documented in EHR or advanced directives reviewed      11/29/23 0902         Extensive chart review has been completed prior to seeing the patient including vital signs, progress/consult notes, orders, medications, and available advance directive documents.   Prognosis:  Hours - Days  Discharge Planning: Anticipated Hospital Death  Care plan was discussed with bedside RN  Time spent: 40 minutes  Thank you for allowing the Palliative Medicine Team to assist in the care of this patient.    Stephane CHRISTELLA Palin, NP  Please contact Palliative Medicine Team phone at [phone] for questions and concerns.

## 2023-12-09 LAB — HEMOGLOBIN A1C

## 2023-12-15 NOTE — Death Summary Note (Signed)
 DEATH SUMMARY   Patient Details  Name: Zachary Brown MRN: [id number] DOB: 1951-01-08  Admission/Discharge Information   Admit Date:  2023-12-20  Date of Death: Date of Death: 12/29/2023  Time of Death: Time of Death: 0714  Length of Stay: December 21, 2023  Referring Physician: Caleen Dirks, MD   Reason(s) for Hospitalization  ICH  Diagnoses  Preliminary cause of death:  ICH: Right thalamic ICH with IVH and SAH, etiology: Likely hypertensive   Secondary Diagnoses (including complications and co-morbidities):  Obstructive hydrocephalus Acute on chronic respiratory failure  Aspiration Pneumonia HTN Dysphagia  AKI leukocytosis   Brief Hospital Course (including significant findings, care, treatment, and services provided and events leading to death)  Zachary Brown is a 73 y.o. year old male with history of COPD, heart failure, presents with left-sided weakness that started abruptly   NIH on Admission 5   ICH: Right thalamic ICH with IVH and SAH, etiology: Likely hypertensive CT head Stable 3 cm hemorrhage in the right thalamus and intraventricular hemorrhage, right greater than left. New multifocal subarachnoid hemorrhage bilaterally, most prominent over the left parietal lobe, right frontal and perculum, and in the occipital lobes bilaterally. CTA head & neck no LVO MRI 8/10 slightly increased in size from previous (estimated volume 18 mL, previously 12.5 mL). Intraventricular extension with blood in the lateral and third ventricles, similar. Stable ventricular size and morphology without progressive hydrocephalus. [facility] 8/12 relatively stable from prior MRI. Surrounding vasogenic edema, mildly progressed. Trace right-to-left shift at the septum pellucidum. Intraventricular extension with blood in both lateral ventricles as well as the third ventricle. Increased lateral and third ventriculomegaly since previous, compatible with progressive hydrocephalus. Scattered subarachnoid hemorrhage involving  both cerebral hemispheres, relatively similar to prior 8/13 CT Head: Stable hematoma and hydrocephalus 2D Echo EF 60 to 65%.  Left atrial size normal. LDL 47 HgbA1c 6.3 Disposition: Palliative care on board, given poor prognosis family decided on comfort care measures   Obstructive Hydrocephalus S/p EVD Managed by neurosurgery CT repeat 8/13 stable hematoma and hydrocephalus   Acute on chronic respiratory failure with hypoxia Aspiration Pneumonia  extubated 8/14 CT Chest 8/11: Pulmonary parenchyma pattern of interstitial lung disease, questionably due to fibrotic hypersensitive pneumonitis or fibrotic nonspecific interstitial pneumonitis CXR 8/12: Stable multifocal pulmonary infiltrates Was on Zosyn   Family decided comfort care measures after meeting with CCM and palliative care   Hypertension HFp EF Home meds: None Was on Norvasc  5 mg, losartan  50 mg    Dysphagia Patient has post-stroke dysphagia Off tube feeding for comfort care measures   Hypernatremia  Hypophosphatemia  NA 151 - was on FW 100 cc Q4h Phos 2.2-2.5  replaced    Other Stroke Risk Factors Congestive heart failure   Other Active Problems AKI, Cr 0.91--1.36 Leukocytosis-WBC 12.5-11.3, T max 101.2 Polycythemia-Hgb 19.4, HCT 56  Pertinent Labs and Studies  Significant Diagnostic Studies DG Chest Port 1 View Result Date: 11/27/2023 CLINICAL DATA:  Acute respiratory failure.  Cerebrovascular accident EXAM: PORTABLE CHEST 1 VIEW COMPARISON:  11/26/2023 FINDINGS: Endotracheal tube and feeding tube unchanged. Stable cardiac silhouette. Increased RIGHT infrahilar airspace disease. Mild edema pattern. No pneumothorax. IMPRESSION: 1. Increased RIGHT infrahilar airspace disease suggesting atelectasis, pneumonia or aspiration pneumonitis. 2. Mild venous congestion pattern Electronically Signed   By: Jackquline Boxer M.D.   On: 11/27/2023 09:33   DG Chest Port 1 View Result Date: 11/26/2023 CLINICAL DATA:  Acute  respiratory failure EXAM: PORTABLE CHEST 1 VIEW COMPARISON:  Chest radiograph November 25, 2023 FINDINGS: Endotracheal  tube terminates 2.1 cm proximal to carina. Enteric tube in place tip below diaphragm extending outside the field of view. Borderline heart size, slightly enlarged to prior. Diffuse interstitial markings throughout both lungs right greater than left, increased to prior. Enlarged right hilum with interval development of right perihilar consolidation/airspace opacity. No significant pleural effusion.  No pneumothorax. No acute osseous abnormality. IMPRESSION: Interval increase in interstitial markings predominantly in right lung, enlarged right hilum and new airspace opacity/consolidation in right lower hemithorax. No significant pleural effusion. Electronically Signed   By: Megan  Zare M.D.   On: 11/26/2023 16:04   CT HEAD WO CONTRAST ( ) Result Date: 11/26/2023 CLINICAL DATA:  Follow-up examination for hemorrhagic stroke. EXAM: CT HEAD WITHOUT CONTRAST TECHNIQUE: Contiguous axial images were obtained from the base of the skull through the vertex without intravenous contrast. RADIATION DOSE REDUCTION: This exam was performed according to the departmental dose-optimization program which includes automated exposure control, adjustment of the mA and/or kV according to patient size and/or use of iterative reconstruction technique. COMPARISON:  Prior CT from 11/24/2023 FINDINGS: Brain: Previously identified intraparenchymal hemorrhage centered at the superior right thalamus is relatively stable in size and morphology. Surrounding vasogenic edema with trace localized right-to-left shift, similar. Intraventricular extension with blood in the lateral and third ventricles. There has been interval placement of a left frontal approach EVD with tip in the left lateral ventricle. Persistent hydrocephalus, slightly improved with biventricular volume measuring 4.8 cm, previously 5.0 cm. Scattered subarachnoid  hemorrhage involving both cerebral hemispheres, overall slightly decreased. No new intracranial hemorrhage or large vessel territory infarct. No mass lesion. No extra-axial fluid collection. Vascular: No asymmetric hyperdense vessel. Calcified atherosclerosis present at skull base. Skull: Left frontal approach EVD with associated scalp soft tissue swelling. Sinuses/Orbits: Globes and orbital soft tissues within normal limits. Paranasal sinuses mastoid air cells remain largely clear. Other: None. IMPRESSION: 1. Interval placement of a left frontal approach EVD with tip in the left lateral ventricle. Persistent hydrocephalus, slightly improved. 2. Relatively stable size and morphology of intraparenchymal hemorrhage centered at the superior right thalamus. Surrounding vasogenic edema with trace localized right-to-left shift, stable. 3. Scattered subarachnoid hemorrhage involving both cerebral hemispheres, overall slightly decreased. 4. No other new acute intracranial abnormality. Electronically Signed   By: Morene Hoard M.D.   On: 11/26/2023 02:29   DG Abd Portable 1V Result Date: 11/25/2023 CLINICAL DATA:  Orogastric tube placement. EXAM: PORTABLE ABDOMEN - 1 VIEW COMPARISON:  Radiograph 11/23/2023 FINDINGS: Tip and side port of the enteric tube below the diaphragm in the stomach. Mild gaseous distention of colon in the upper abdomen. IMPRESSION: Tip and side port of the enteric tube below the diaphragm in the stomach. Electronically Signed   By: Andrea Gasman M.D.   On: 11/25/2023 10:21   DG CHEST PORT 1 VIEW Result Date: 11/25/2023 CLINICAL DATA:  Endotracheal tube placement EXAM: PORTABLE CHEST 1 VIEW COMPARISON:  12:19 a.m. FINDINGS: Endotracheal tube has been slightly advanced with its tip now seen 4.1 cm above the carina. Sparse multifocal pulmonary infiltrates again identified. The lungs remain symmetrically well expanded. No pneumothorax or pleural effusion. Cardiac size within normal limits.  No acute bone abnormality. IMPRESSION: 1. Endotracheal tube slightly advanced with its tip now seen 4.1 cm above the carina. 2. Stable multifocal pulmonary infiltrates. Electronically Signed   By: Dorethia Molt M.D.   On: 11/25/2023 01:46   Portable Chest x-ray Result Date: 11/25/2023 CLINICAL DATA:  Endotracheal tube placement EXAM: PORTABLE CHEST 1 VIEW COMPARISON:  11/21/2023  FINDINGS: Endotracheal tube is seen 5.3 cm above the carina. The lungs are symmetrically well expanded. Patchy multifocal pulmonary infiltrates again noted, stable. No pneumothorax or pleural effusion. Cardiac size within limits. Pulmonary vascularity is normal. No acute bone abnormality. IMPRESSION: 1. Endotracheal tube 5.3 cm above the carina. 2. Stable multifocal pulmonary infiltrates. Electronically Signed   By: Dorethia Molt M.D.   On: 11/25/2023 01:45   CT HEAD WO CONTRAST ( ) Result Date: 11/25/2023 CLINICAL DATA:  Initial evaluation for acute mental status change. EXAM: CT HEAD WITHOUT CONTRAST TECHNIQUE: Contiguous axial images were obtained from the base of the skull through the vertex without intravenous contrast. RADIATION DOSE REDUCTION: This exam was performed according to the departmental dose-optimization program which includes automated exposure control, adjustment of the mA and/or kV according to patient size and/or use of iterative reconstruction technique. COMPARISON:  Prior CT from 11/22/2023 as well as previous MRI from 11/23/2023 FINDINGS: Brain: Previously identified intraparenchymal hemorrhage centered at the superior right thalamus again seen, measuring 3.8 x 2.3 x 3.9 cm, relatively stable from prior MRI. Surrounding vasogenic edema, mildly progressed. Trace right-to-left shift at the septum pellucidum. Intraventricular extension with blood in both lateral ventricles as well as the third ventricle. Increased lateral and third ventriculomegaly since previous, compatible with associated hydrocephalus.  Scattered subarachnoid hemorrhage involving both cerebral hemispheres, grossly similar to previous MRI. No other definite new intracranial hemorrhage. No other acute large vessel territory infarct. No mass lesion. No extra-axial fluid collection. Vascular: Some IV contrast material remains on board. Scattered vascular calcifications noted within the carotid siphons. Skull: Scalp soft tissues and calvarium demonstrate no new finding. Sinuses/Orbits: Globes orbital soft tissues within normal limits. Paranasal sinuses and mastoid air cells remain largely clear. Other: None. IMPRESSION: 1. 3.8 x 2.3 x 3.9 cm intraparenchymal hemorrhage centered at the superior right thalamus, relatively stable from prior MRI. Surrounding vasogenic edema, mildly progressed. Trace right-to-left shift at the septum pellucidum. 2. Intraventricular extension with blood in both lateral ventricles as well as the third ventricle. Increased lateral and third ventriculomegaly since previous, compatible with progressive hydrocephalus. 3. Scattered subarachnoid hemorrhage involving both cerebral hemispheres, relatively similar to prior. Electronically Signed   By: Morene Hoard M.D.   On: 11/25/2023 00:58   CT Chest High Resolution Result Date: 11/24/2023 CLINICAL DATA:  Diffuse/interstitial lung disease. EXAM: CT CHEST WITHOUT CONTRAST TECHNIQUE: Multidetector CT imaging of the chest was performed following the standard protocol without intravenous contrast. High resolution imaging of the lungs, as well as inspiratory and expiratory imaging, was performed. RADIATION DOSE REDUCTION: This exam was performed according to the departmental dose-optimization program which includes automated exposure control, adjustment of the mA and/or kV according to patient size and/or use of iterative reconstruction technique. COMPARISON:  01/20/2016. FINDINGS: Cardiovascular: Atherosclerotic calcification of the aorta, aortic valve and coronary arteries.  Heart is enlarged. No pericardial effusion. Mediastinum/Nodes: No pathologically enlarged mediastinal or axillary lymph nodes. Hilar regions are difficult to definitively evaluate without IV contrast. Esophagus is grossly unremarkable. Lungs/Pleura: Image quality is degraded by expiratory phase imaging. Centrilobular emphysema. Patchy coarsened ground-glass bilaterally, with an upper and midlung zone predominance, less extensive than on 01/20/2016. There is air trapping. No pleural fluid. Debris in the airway. Upper Abdomen: Liver margin is slightly irregular. Vicarious excretion of contrast in the gallbladder. Visualized portions of the liver, gallbladder, adrenal glands, kidneys, spleen, pancreas, stomach and bowel are otherwise grossly unremarkable. No upper abdominal adenopathy. Musculoskeletal: Minimal degenerative change in the spine. IMPRESSION: 1. Pulmonary parenchymal pattern  of interstitial lung disease, as detailed above, may be due to fibrotic hypersensitivity pneumonitis or fibrotic nonspecific interstitial pneumonitis. Findings are suggestive of an alternative diagnosis (not UIP) per consensus guidelines: Diagnosis of Idiopathic Pulmonary Fibrosis: An Official ATS/ERS/JRS/ALAT Clinical Practice Guideline. Am JINNY [employer] Crit Care Med Vol 198, Iss 5, [phone], Dec 14 2016. 2. Cirrhosis. 3. Aortic atherosclerosis (ICD10-I70.0). Coronary artery calcification. 4.  Emphysema (ICD10-J43.9). Electronically Signed   By: Newell Eke M.D.   On: 11/24/2023 15:32   MR BRAIN WO CONTRAST Result Date: 11/23/2023 CLINICAL DATA:  Follow-up examination for hemorrhagic stroke. EXAM: MRI HEAD WITHOUT CONTRAST TECHNIQUE: Multiplanar, multiecho pulse sequences of the brain and surrounding structures were obtained without intravenous contrast. COMPARISON:  Comparison made with CTs from 11/22/2023 FINDINGS: Brain: Examination severely degraded by motion artifact, limiting assessment. Cerebral volume within normal limits  for age. Patchy T2/FLAIR hyperintensity involving the periventricular and deep white matter, consistent with chronic small vessel ischemic disease, moderate to advanced in nature. Previously identified intraparenchymal hemorrhage centered at the superior right thalamus again seen. Hemorrhage measures slightly increased in size now measuring 4.0 x 2.4 x 3.8 cm (estimated volume 18 mL, previously 12.5 mL on head CT from 11/22/2023). Mild surrounding vasogenic edema, slightly progressed. 6 mm of localized right-to-left shift at the septum pellucidum, also progressed. No visible lesion seen underlying the acute hemorrhage. Intraventricular extension again seen with blood in the lateral and third ventricles, similar. Stable ventricular size and morphology without progressive hydrocephalus. No convincing transependymal CSF. Scattered subarachnoid hemorrhage involving the bilateral cerebral hemispheres again seen, grossly similar. No other new intracranial hemorrhage. No other acute or subacute infarct. Few additional chronic parenchymal hemorrhages noted at the left frontal lobe and left thalamic capsular region (series 12, images 19, 47). No visible mass lesion. No extra-axial fluid collection. Pituitary gland and suprasellar region grossly within normal limits. Vascular: Major intracranial vascular flow voids are grossly maintained at the skull base. Skull and upper cervical spine: Craniocervical junction within normal limits. Bone marrow signal intensity normal. No scalp soft tissue abnormality. Sinuses/Orbits: Globes orbital soft tissues grossly within normal limits. Paranasal sinuses are largely clear. No significant mastoid effusion. Other: None. IMPRESSION: 1. Motion degraded exam. 2. 4.0 x 2.4 x 3.8 cm intraparenchymal hemorrhage centered at the superior right thalamus, slightly increased in size from previous (estimated volume 18 mL, previously 12.5 mL). Mild surrounding vasogenic edema with 6 mm of localized  right-to-left shift at the septum pellucidum, mildly progressed. No visible lesion seen underlying the acute hemorrhage. 3. Intraventricular extension with blood in the lateral and third ventricles, similar. Stable ventricular size and morphology without progressive hydrocephalus. 4. Scattered subarachnoid hemorrhage involving the bilateral cerebral hemispheres, grossly similar. 5. Underlying moderate to advanced chronic microvascular ischemic disease. Electronically Signed   By: Morene Hoard M.D.   On: 11/23/2023 18:38   DG Abd 1 View Result Date: 11/23/2023 CLINICAL DATA:  [id number]. Screening for metal prior to MRI. MRI clearance film. EXAM: ABDOMEN - 1 VIEW COMPARISON:  None Available. FINDINGS: The bowel gas pattern is normal. No radio-opaque calculi or other significant radiographic abnormality are seen. There are no radiopaque foreign bodies in the field, but please note that a portion of the left lateral hemiabdomen was excluded by collimation. There are overlying telemetry leads.  Lumbar spondylosis. IMPRESSION: 1. No radiopaque foreign bodies in the field, but please note that a portion of the left lateral hemiabdomen was excluded by collimation. 2. Lumbar spondylosis. Electronically Signed   By: Francis Quam  M.D.   On: 11/23/2023 06:59   CT HEAD WO CONTRAST ( ) Result Date: 11/22/2023 CLINICAL DATA:  Follow-up examination for intracranial hemorrhage. EXAM: CT HEAD WITHOUT CONTRAST TECHNIQUE: Contiguous axial images were obtained from the base of the skull through the vertex without intravenous contrast. RADIATION DOSE REDUCTION: This exam was performed according to the departmental dose-optimization program which includes automated exposure control, adjustment of the mA and/or kV according to patient size and/or use of iterative reconstruction technique. COMPARISON:  CT from earlier the same day. FINDINGS: Brain: Intraparenchymal hemorrhage centered at the superior right thalamus again seen,  measuring slightly increased in size at 3.6 x 2.1 x 3.3 cm (estimated volume 12.5 mL). Mild surrounding edema, slightly increased from prior. Intraventricular extension with blood in the right greater than left lateral and third ventricles, similar. Ventricular size and morphology is relatively stable without overt hydrocephalus. Scattered small volume subarachnoid hemorrhage involving the bilateral cerebral hemispheres, slightly more prominent as compared to previous. No other acute intracranial hemorrhage. No mass lesion or significant midline shift. No extra-axial fluid collection. Chronic microvascular ischemic disease again noted. Vascular: Some IV contrast material remains on board related to prior CTA. Calcified atherosclerosis present at skull base. Skull: Tissues soft tissue swelling present at the scalp soft tissues near the vertex. Calvarium intact. Sinuses/Orbits: Right gaze preference noted. Paranasal sinuses and mastoid air cells are largely clear. Other: None. IMPRESSION: 1. Slight interval increase in size of intraparenchymal hemorrhage centered at the superior right thalamus, now measuring 3.6 x 2.1 x 3.3 cm (estimated volume 12.5 mL). Mild surrounding edema, slightly increased from prior. 2. Intraventricular extension with blood in the right greater than left lateral and third ventricles, similar. Ventricular size and morphology is relatively stable without overt hydrocephalus. 3. Scattered small volume subarachnoid hemorrhage involving the bilateral cerebral hemispheres, slightly more prominent as compared to previous. 4. No other new acute intracranial abnormality. Electronically Signed   By: Morene Hoard M.D.   On: 11/22/2023 22:04   ECHOCARDIOGRAM COMPLETE Result Date: 11/22/2023    ECHOCARDIOGRAM REPORT   Patient Name:   KOREN LEVANDER DARING Date of Exam: 11/22/2023 Medical Rec #:  [id number]      Height:       69.0 in Accession #:    [id number]     Weight:       176.6 lb Date of Birth:   1950/05/03      BSA:          1.960 m Patient Age:    72 years       BP:           148/90 mmHg Patient Gender: M              HR:           71 bpm. Exam Location:  Inpatient Procedure: 2D Echo (Both Spectral and Color Flow Doppler were utilized during            procedure). Indications:    syncope  History:        Patient has prior history of Echocardiogram examinations, most                 recent 01/25/2016. Risk Factors:Diabetes.  Sonographer:    Tinnie Barefoot RDCS Referring Phys: [id number] LAURA P CLARK IMPRESSIONS  1. Left ventricular ejection fraction, by estimation, is 60 to 65%. The left ventricle has normal function. The left ventricle has no regional wall motion abnormalities. There is mild left ventricular hypertrophy. Left ventricular  diastolic parameters are indeterminate. There is the interventricular septum is flattened in systole, consistent with right ventricular pressure overload.  2. Right ventricular systolic function mild to moderately reduced systolic functoin. The right ventricular size is moderate to severely enlarged. Tricuspid regurgitation signal is inadequate for assessing PA pressure.  3. The mitral valve is normal in structure. No evidence of mitral valve regurgitation. No evidence of mitral stenosis.  4. The aortic valve is tricuspid. Aortic valve regurgitation is not visualized. No aortic stenosis is present.  5. There is mild dilatation of the ascending aorta, measuring 37 mm.  6. The inferior vena cava is normal in size with greater than 50% respiratory variability, suggesting right atrial pressure of 3 mmHg. FINDINGS  Left Ventricle: Left ventricular ejection fraction, by estimation, is 60 to 65%. The left ventricle has normal function. The left ventricle has no regional wall motion abnormalities. The left ventricular internal cavity size was normal in size. There is  mild left ventricular hypertrophy. The interventricular septum is flattened in systole, consistent with right  ventricular pressure overload. Left ventricular diastolic parameters are indeterminate. Right Ventricle: The right ventricular size is moderate to severely enlarged. Right vetricular wall thickness was not well visualized. Right ventricular systolic function mild to moderately reduced systolic functoin. Tricuspid regurgitation signal is inadequate for assessing PA pressure. Left Atrium: Left atrial size was normal in size. Right Atrium: Right atrial size was normal in size. Pericardium: There is no evidence of pericardial effusion. Mitral Valve: The mitral valve is normal in structure. No evidence of mitral valve regurgitation. No evidence of mitral valve stenosis. Tricuspid Valve: The tricuspid valve is not well visualized. Tricuspid valve regurgitation is trivial. No evidence of tricuspid stenosis. Aortic Valve: The aortic valve is tricuspid. Aortic valve regurgitation is not visualized. No aortic stenosis is present. Aortic valve mean gradient measures 3.5 mmHg. Aortic valve peak gradient measures 7.5 mmHg. Aortic valve area, by VTI measures 2.44 cm. Pulmonic Valve: The pulmonic valve was not well visualized. Pulmonic valve regurgitation is not visualized. No evidence of pulmonic stenosis. Aorta: The aortic root is normal in size and structure. There is mild dilatation of the ascending aorta, measuring 37 mm. Venous: The inferior vena cava is normal in size with greater than 50% respiratory variability, suggesting right atrial pressure of 3 mmHg. IAS/Shunts: No atrial level shunt detected by color flow Doppler.  LEFT VENTRICLE PLAX 2D LVIDd:         4.20 cm   Diastology LVIDs:         2.50 cm   LV e' medial:    7.29 cm/s LV PW:         1.20 cm   LV E/e' medial:  9.4 LV IVS:        1.20 cm   LV e' lateral:   9.03 cm/s LVOT diam:     2.00 cm   LV E/e' lateral: 7.6 LV SV:         66 LV SV Index:   34 LVOT Area:     3.14 cm  RIGHT VENTRICLE             IVC RV Basal diam:  3.00 cm     IVC diam: 1.60 cm RV S prime:      10.70 cm/s TAPSE (M-mode): 1.7 cm LEFT ATRIUM             Index        RIGHT ATRIUM  Index LA diam:        3.40 cm 1.74 cm/m   RA Area:     15.10 cm LA Vol (A2C):   43.3 ml 22.10 ml/m  RA Volume:   38.80 ml  19.80 ml/m LA Vol (A4C):   25.7 ml 13.12 ml/m LA Biplane Vol: 34.9 ml 17.81 ml/m  AORTIC VALVE AV Area (Vmax):    2.34 cm AV Area (Vmean):   2.37 cm AV Area (VTI):     2.44 cm AV Vmax:           137.09 cm/s AV Vmean:          86.687 cm/s AV VTI:            0.269 m AV Peak Grad:      7.5 mmHg AV Mean Grad:      3.5 mmHg LVOT Vmax:         102.00 cm/s LVOT Vmean:        65.500 cm/s LVOT VTI:          0.209 m LVOT/AV VTI ratio: 0.78  AORTA Ao Root diam: 3.30 cm Ao Asc diam:  3.70 cm MITRAL VALVE MV Area (PHT): 3.17 cm    SHUNTS MV Decel Time: 239 msec    Systemic VTI:  0.21 m MV E velocity: 68.30 cm/s  Systemic Diam: 2.00 cm MV A velocity: 77.80 cm/s MV E/A ratio:  0.88 Dorn Ross MD Electronically signed by Dorn Ross MD Signature Date/Time: 11/22/2023/4:43:24 PM    Final    CT ANGIO HEAD NECK W WO CM Result Date: 11/22/2023 EXAM: CTA Head and Neck without and with Intravenous Contrast. CT Head without Contrast. CLINICAL HISTORY: Stroke, hemorrhagic. Chief complaints; Cerebrovascular Accident. TECHNIQUE: Axial CTA images of the head and neck performed with and without intravenous contrast. MIP reconstructed images were created and reviewed. Axial computed tomography images of the head/brain performed without intravenous contrast. Note: Per PQRS, the description of internal carotid artery percent stenosis, including 0 percent or normal exam, is based on [place] [employer] Symptomatic Carotid Endarterectomy Trial (NASCET) criteria. Dose reduction technique was used including one or more of the following: automated exposure control, adjustment of mA and kV according to patient size, and/or iterative reconstruction. CONTRAST: 75 mL iohexol  (OMNIPAQUE ) 350 mg/mL injection. COMPARISON: None  provided. FINDINGS: CT HEAD: BRAIN: Right thalamic and intraventricular hemorrhage is stable. Blood in the left lateral ventricle and third ventricle is stable. Scattered areas of subarachnoid hemorrhage are again noted. No acute intraparenchymal hemorrhage. No mass lesion. No CT evidence for acute territorial infarct. No midline shift or extra-axial collection. VENTRICLES: Ventricle size is stable. No hydrocephalus. ORBITS: The orbits are unremarkable. SINUSES AND MASTOIDS: The paranasal sinuses and mastoid air cells are clear. CTA NECK: COMMON CAROTID ARTERIES: No significant stenosis. No dissection or occlusion. INTERNAL CAROTID ARTERIES: Atherosclerotic changes are present at the proximal right ICA without significant stenosis. Atherosclerotic changes are present at the left carotid bifurcation proximal left ICA without significant stenosis. No stenosis by NASCET criteria. No dissection or occlusion. VERTEBRAL ARTERIES: Atherosclerotic calcifications are present at the origin of the right vertebral artery with less than 50% stenosis. No dissection or occlusion. CTA HEAD: ANTERIOR CEREBRAL ARTERIES: No significant stenosis. No occlusion. No aneurysm. MIDDLE CEREBRAL ARTERIES: No significant stenosis. No occlusion. No aneurysm. POSTERIOR CEREBRAL ARTERIES: A fetal type right posterior cerebral artery is present. No significant stenosis. No occlusion. No aneurysm. BASILAR ARTERY: No significant stenosis. No occlusion. No aneurysm. OTHER: No focal vascular lesion  is associated with the area of hemorrhage. No spot sign is present. No aneurysms are present. SOFT TISSUES: No acute finding. No masses or lymphadenopathy. BONES: No acute osseous abnormality. IMPRESSION: 1. Stable right thalamic and intraventricular hemorrhage, with stable blood in the left lateral ventricle and third ventricle. Scattered areas of subarachnoid hemorrhage are again noted. 2. No focal vascular lesion, spot sign, or aneurysm associated with  the area of hemorrhage. 3. Atherosclerotic changes in the proximal right ICA, left carotid bifurcation/proximal left ICA, and cavernous ICAs bilaterally, all without significant stenosis. Atherosclerotic calcifications at the origin of the right vertebral artery with less than 50% stenosis. Electronically signed by: Lonni Necessary MD 11/22/2023 10:58 AM EDT RP Workstation: [id number]   CT HEAD WO CONTRAST ( ) Result Date: 11/22/2023 EXAM: CT HEAD WITHOUT CONTRAST 11/22/2023 04:49:10 AM TECHNIQUE: CT of the head was performed without the administration of intravenous contrast. Automated exposure control, iterative reconstruction, and/or weight based adjustment of the mA/kV was utilized to reduce the radiation dose to as low as reasonably achievable. COMPARISON: CT head without contrast 11/21/2023 at her and off hospital. CLINICAL HISTORY: Stroke, hemorrhagic (Ped 0-17y); basal ganglia with intraventricular hemorrhage. FINDINGS: BRAIN AND VENTRICLES: 3 cm hemorrhage in the right thalamus is stable. Intraventricular hemorrhage right greater than left is similar to the prior study. Ventricle size is stable. Periventricular white matter hypoattenuation is moderately advanced for age. New multifocal subarachnoid hemorrhage is present bilaterally. Most prominent areas are over the left parietal lobe, right frontal and perculum and in the occipital lobes bilaterally. ORBITS: No acute abnormality. SINUSES: No acute abnormality. SOFT TISSUES AND SKULL: Scalp soft tissue swelling is present near the vertex. No underlying fracture or foreign body is present. VASCULATURE: Atherosclerotic calcifications are present in the cavernous carotid arteries bilaterally. No hyperdense vessel is present. IMPRESSION: 1. Stable 3 cm hemorrhage in the right thalamus and intraventricular hemorrhage, right greater than left. 2. New multifocal subarachnoid hemorrhage bilaterally, most prominent over the left parietal lobe, right frontal  and perculum, and in the occipital lobes bilaterally. 3. Scalp soft tissue swelling near the vertex. No underlying fracture or foreign body is present. 4. Moderately advanced periventricular white matter hypoattenuation for age. 5. Atherosclerotic calcifications in the cavernous carotid arteries bilaterally. No hyperdense vessel is present. Electronically signed by: Lonni Necessary MD 11/22/2023 05:49 AM EDT RP Workstation: [id number]   US  Abdomen Limited RUQ (LIVER/GB) Result Date: 11/22/2023 CLINICAL DATA:  73059 Hyperbilirubinemia 26940 EXAM: ULTRASOUND ABDOMEN LIMITED RIGHT UPPER QUADRANT COMPARISON:  None Available. FINDINGS: Gallbladder: No gallstones or wall thickening visualized. No sonographic Murphy sign noted by sonographer. Common bile duct: Diameter: 4 mm Liver: No focal lesion identified. Within normal limits in parenchymal echogenicity. Portal vein is patent on color Doppler imaging with normal direction of blood flow towards the liver. Other: None. IMPRESSION: 1. Normal right upper quadrant sonogram. Electronically Signed   By: Dorethia Molt M.D.   On: 11/22/2023 00:25   DG CHEST PORT 1 VIEW Result Date: 11/21/2023 CLINICAL DATA:  200808.  Hypoxia. EXAM: PORTABLE CHEST 1 VIEW COMPARISON:  PA Lat chest 02/05/2016 FINDINGS: Mild cardiomegaly. The mediastinum is normally outlined. There is calcification in the aortic arch. There is increased central vascular engorgement. There is increased central interstitial edema as well. Trace pleural effusions are beginning to form. Findings superimposed on mild fibrosis. There is asymmetric haziness in the periphery of the right upper lobe which could be due to asymmetric edema or pneumonia. No other focal infiltrate is seen. There is thoracic spondylosis.  Bilateral shoulder DJD. IMPRESSION: 1. Cardiomegaly with increased central vascular engorgement and central interstitial edema. 2. Asymmetric haziness in the periphery of the right upper lobe which  could be due to asymmetric ground-glass edema or pneumonia. 3. Trace pleural effusions. 4. Aortic atherosclerosis. Electronically Signed   By: Francis Quam M.D.   On: 11/21/2023 21:57    Microbiology Recent Results (from the past 240 hours)  MRSA Next Gen by PCR, Nasal     Status: None   Collection Time: 11/21/23  8:59 PM   Specimen: Nasal Mucosa; Nasal Swab  Result Value Ref Range Status   MRSA by PCR Next Gen NOT DETECTED NOT DETECTED Final    Comment: (NOTE) The GeneXpert MRSA Assay (FDA approved for NASAL specimens only), is one component of a comprehensive MRSA colonization surveillance program. It is not intended to diagnose MRSA infection nor to guide or monitor treatment for MRSA infections. Test performance is not FDA approved in patients less than 32 years old. Performed at [facility], [address] [place], [place] [address]   Culture, Respiratory w Gram Stain     Status: None   Collection Time: 11/26/23  3:34 PM   Specimen: Tracheal Aspirate; Respiratory  Result Value Ref Range Status   Specimen Description TRACHEAL ASPIRATE  Final   Special Requests NONE  Final   Gram Stain   Final    ABUNDANT WBC PRESENT, PREDOMINANTLY PMN FEW GRAM POSITIVE COCCI RARE GRAM NEGATIVE RODS RARE GRAM POSITIVE RODS    Culture   Final    Normal respiratory flora-no Staph aureus or Pseudomonas seen Performed at [facility], [address], [place], [place] [address]    Report Status 11/28/2023 FINAL  Final  MRSA Next Gen by PCR, Nasal     Status: None   Collection Time: 11/26/23  4:16 PM   Specimen: Nasal Mucosa; Nasal Swab  Result Value Ref Range Status   MRSA by PCR Next Gen NOT DETECTED NOT DETECTED Final    Comment: (NOTE) The GeneXpert MRSA Assay (FDA approved for NASAL specimens only), is one component of a comprehensive MRSA colonization surveillance program. It is not intended to diagnose MRSA infection nor to guide or monitor treatment for MRSA  infections. Test performance is not FDA approved in patients less than 18 years old. Performed at [facility], [address] [place], [place] [address]   Resp panel by RT-PCR (RSV, Flu A&B, Covid) Anterior Nasal Swab     Status: None   Collection Time: 11/27/23 10:57 AM   Specimen: Anterior Nasal Swab  Result Value Ref Range Status   SARS Coronavirus 2 by RT PCR NEGATIVE NEGATIVE Final   Influenza A by PCR NEGATIVE NEGATIVE Final   Influenza B by PCR NEGATIVE NEGATIVE Final    Comment: (NOTE) The Xpert Xpress SARS-CoV-2/FLU/RSV plus assay is intended as an aid in the diagnosis of influenza from Nasopharyngeal swab specimens and should not be used as a sole basis for treatment. Nasal washings and aspirates are unacceptable for Xpert Xpress SARS-CoV-2/FLU/RSV testing.  Fact Sheet for Patients: BloggerCourse.com  Fact Sheet for Healthcare Providers: SeriousBroker.it  This test is not yet approved or cleared by the [place]  FDA and has been authorized for detection and/or diagnosis of SARS-CoV-2 by FDA under an Emergency Use Authorization (EUA). This EUA will remain in effect (meaning this test can be used) for the duration of the COVID-19 declaration under Section 564(b)(1) of the Act, 21 U.S.C. section  360bbb-3(b)(1), unless the authorization is terminated or revoked.     Resp Syncytial Virus by PCR NEGATIVE NEGATIVE Final    Comment: (NOTE) Fact Sheet for Patients: BloggerCourse.com  Fact Sheet for Healthcare Providers: SeriousBroker.it  This test is not yet approved or cleared by the [place]  FDA and has been authorized for detection and/or diagnosis of SARS-CoV-2 by FDA under an Emergency Use Authorization (EUA). This EUA will remain in effect (meaning this test can be used) for the duration of the COVID-19 declaration under Section 564(b)(1) of the Act,  21 U.S.C. section 360bbb-3(b)(1), unless the authorization is terminated or revoked.  Performed at [facility], [address] [place], [place] [address]   Respiratory (~20 pathogens) panel by PCR     Status: None   Collection Time: 11/27/23 10:57 AM   Specimen: Nasopharyngeal Swab; Respiratory  Result Value Ref Range Status   Adenovirus NOT DETECTED NOT DETECTED Final   Coronavirus 229E NOT DETECTED NOT DETECTED Final    Comment: (NOTE) The Coronavirus on the Respiratory Panel, DOES NOT test for the novel  Coronavirus (2019 nCoV)    Coronavirus HKU1 NOT DETECTED NOT DETECTED Final   Coronavirus NL63 NOT DETECTED NOT DETECTED Final   Coronavirus OC43 NOT DETECTED NOT DETECTED Final   Metapneumovirus NOT DETECTED NOT DETECTED Final   Rhinovirus / Enterovirus NOT DETECTED NOT DETECTED Final   Influenza A NOT DETECTED NOT DETECTED Final   Influenza B NOT DETECTED NOT DETECTED Final   Parainfluenza Virus 1 NOT DETECTED NOT DETECTED Final   Parainfluenza Virus 2 NOT DETECTED NOT DETECTED Final   Parainfluenza Virus 3 NOT DETECTED NOT DETECTED Final   Parainfluenza Virus 4 NOT DETECTED NOT DETECTED Final   Respiratory Syncytial Virus NOT DETECTED NOT DETECTED Final   Bordetella pertussis NOT DETECTED NOT DETECTED Final   Bordetella Parapertussis NOT DETECTED NOT DETECTED Final   Chlamydophila pneumoniae NOT DETECTED NOT DETECTED Final   Mycoplasma pneumoniae NOT DETECTED NOT DETECTED Final    Comment: Performed at [facility], [address], [place], [place] [address]    Lab Basic Metabolic Panel: Recent Labs  Lab 11/24/23 0418 11/25/23 0045 11/25/23 0658 11/25/23 1128 11/26/23 0525 11/26/23 1535 11/27/23 0542 11/28/23 0600 11/29/23 0830  NA 138   < > 141   < > 145 148* 146* 151* 150*  K 4.1   < > 4.0   < > 3.8 4.4 3.6 3.7 4.1  CL 100  --  107  --  110  --  112* 112* 113*  CO2 29  --  21*  --  26  --  28 29 28   GLUCOSE 139*  --  176*  --  199*  --  212*  209* 197*  BUN 20  --  36*  --  34*  --  31* 33* 34*  CREATININE 0.91  --  1.36*  --  1.04  --  1.08 1.20 1.15  CALCIUM 9.0  --  9.0  --  9.0  --  8.9 8.9 9.0  MG 2.0  --  2.1  --  2.1  --   --   --  2.3  PHOS 3.1  --  2.7  --  2.4*  --  2.2* 2.5  --    < > = values in this interval not displayed.   Liver Function Tests: Recent Labs  Lab 11/25/23 0658 11/26/23 0525 11/28/23 0600  AST 22 50* 26  ALT 23 62* 51*  ALKPHOS 59 48 53  BILITOT 3.8* 3.1* 2.1*  PROT 6.7 5.8* 6.1*  ALBUMIN 3.6 2.9* 2.6*   No results for input(s): LIPASE, AMYLASE in the last 168 hours. No results for input(s): AMMONIA in the last 168 hours. CBC: Recent Labs  Lab 11/25/23 0658 11/25/23 1128 11/25/23 1327 11/26/23 0525 11/26/23 1535 11/27/23 0942 11/28/23 0600  WBC 18.4* 13.0*  --  11.5*  --  13.1* 11.3*  NEUTROABS  --   --   --   --   --  10.4*  --   HGB 21.3* 20.2* 19.0* 19.7* 18.7* 19.5* 19.4*  HCT 62.8* 59.0* 56.0* 58.7* 55.0* 58.1* 58.5*  MCV 93.7 93.4  --  95.3  --  95.4 95.9  PLT 211 135*  --  130*  --  109* 123*   Cardiac Enzymes: No results for input(s): CKTOTAL, CKMB, CKMBINDEX, TROPONINI in the last 168 hours. Sepsis Labs: Recent Labs  Lab 11/25/23 1128 11/26/23 0525 11/26/23 1547 11/27/23 0942 11/28/23 0600  PROCALCITON  --   --  <0.10  --   --   WBC 13.0* 11.5*  --  13.1* 11.3*    Procedures/Operations  EVD placement   Teaghan Melrose December 29, 2023, 8:21 AM

## 2023-12-15 NOTE — Progress Notes (Signed)
 Pt time of death 0714 pronounced by Dena Robe RN and Lauraine Letters RN. Family at bedside, Dr. Jerri, [facility], and medical examiner notified.

## 2023-12-15 DEATH — deceased
